# Patient Record
Sex: Male | Born: 2000 | Race: Black or African American | Hispanic: No | Marital: Single
Health system: Southern US, Community
[De-identification: ages and names within clinical notes are randomized; demographics above are authoritative.]

## PROBLEM LIST (undated history)

## (undated) HISTORY — PX: HIP SURGERY: SHX245

---
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
## 2019-07-20 ENCOUNTER — Encounter (HOSPITAL_COMMUNITY): Payer: Self-pay | Admitting: Emergency Medicine

## 2019-07-20 ENCOUNTER — Emergency Department (HOSPITAL_COMMUNITY)
Admission: EM | Admit: 2019-07-20 | Discharge: 2019-07-20 | Disposition: A | Discharge status: Home / Self Care | Payer: Medicaid Other | Attending: Emergency Medicine | Admitting: Emergency Medicine

## 2019-07-20 ENCOUNTER — Other Ambulatory Visit: Payer: Self-pay

## 2019-07-20 DIAGNOSIS — F17210 Nicotine dependence, cigarettes, uncomplicated: Secondary | ICD-10-CM | POA: Diagnosis not present

## 2019-07-20 DIAGNOSIS — Z202 Contact with and (suspected) exposure to infections with a predominantly sexual mode of transmission: Secondary | ICD-10-CM

## 2019-07-20 DIAGNOSIS — R369 Urethral discharge, unspecified: Secondary | ICD-10-CM | POA: Diagnosis present

## 2019-07-20 LAB — URINALYSIS, ROUTINE W REFLEX MICROSCOPIC
Bacteria, UA: NONE SEEN
Bilirubin Urine: NEGATIVE
Glucose, UA: NEGATIVE mg/dL
Hgb urine dipstick: NEGATIVE
Ketones, ur: NEGATIVE mg/dL
Nitrite: NEGATIVE
Protein, ur: 30 mg/dL — AB
Specific Gravity, Urine: 1.031 — ABNORMAL HIGH (ref 1.005–1.030)
WBC, UA: >50 WBC/hpf — ABNORMAL HIGH (ref 0–5)
pH: 5 (ref 5.0–8.0)

## 2019-07-20 MED ORDER — METRONIDAZOLE 500 MG PO TABS: 500.0000 mg | ORAL_TABLET | Freq: Two times a day (BID) | ORAL | 0 refills | Status: DC

## 2019-07-20 MED ORDER — CEFTRIAXONE SODIUM 250 MG IJ SOLR
250.0000 mg | Freq: Once | INTRAMUSCULAR | Status: AC
  Administered 2019-07-20: 05:00:00 250 mg via INTRAMUSCULAR
  Filled 2019-07-20: qty 250

## 2019-07-20 MED ORDER — STERILE WATER FOR INJECTION IJ SOLN
INTRAMUSCULAR | Status: AC
  Administered 2019-07-20: 10 mL
  Filled 2019-07-20: qty 10

## 2019-07-20 MED ORDER — AZITHROMYCIN 250 MG PO TABS
1000.0000 mg | ORAL_TABLET | Freq: Once | ORAL | Status: AC
  Administered 2019-07-20: 05:00:00 1000 mg via ORAL
  Filled 2019-07-20: qty 4

## 2019-07-20 NOTE — ED Provider Notes (Signed)
[facility] EMERGENCY DEPARTMENT Provider Note   CSN: [id number] Arrival date & time: 07/20/19  6789     History   Chief Complaint Chief Complaint  Patient presents with  . Exposure to STD    HPI ERIK BURKETT is a 18 y.o. male.     Patient is a 18 year old male presenting with complaints of urethral discharge and STD exposure.  He tells me he got a call from his girlfriend stating that she was diagnosed with gonorrhea, chlamydia, and trichomonas.  Patient reports some dysuria.  He denies any fevers or chills.  He denies any blisters or rash.  The history is provided by the patient.  Exposure to STD This is a new problem. The current episode started more than 1 week ago. The problem occurs constantly. The problem has been gradually worsening. Exacerbated by: Urinating. Nothing relieves the symptoms.    History reviewed. No pertinent past medical history.  Patient Active Problem List   Diagnosis Date Noted  . Possible exposure to STD 11/18/2017  . Exposure to chlamydia 11/18/2017    Past Surgical History:  Procedure Laterality Date  . HIP SURGERY          Home Medications    Prior to Admission medications   Medication Sig Start Date End Date Taking? Authorizing Provider  ibuprofen (ADVIL,MOTRIN) 600 MG tablet Take 1 tablet (600 mg total) by mouth every 6 (six) hours as needed. 07/20/17   Antony Madura, PA-C    Family History No family history on file.  Social History Social History   Tobacco Use  . Smoking status: Light Tobacco Smoker  . Smokeless tobacco: Never Used  Substance Use Topics  . Alcohol use: No  . Drug use: No     Allergies   Patient has no known allergies.   Review of Systems Review of Systems  All other systems reviewed and are negative.    Physical Exam Updated Vital Signs BP 123/76 (BP Location: Right Arm)   Pulse 83   Temp 99.3 F (37.4 C) (Oral)   Resp 16   Ht 5\' 9"  (1.753 m)   Wt 77.1 kg    SpO2 99%   BMI 25.10 kg/m   Physical Exam Vitals signs and nursing note reviewed.  Constitutional:      General: He is not in acute distress.    Appearance: Normal appearance. He is not ill-appearing, toxic-appearing or diaphoretic.  HENT:     Head: Normocephalic and atraumatic.  Pulmonary:     Effort: Pulmonary effort is normal.  Genitourinary:    Penis: Normal.      Comments: There is a slight purulent urethral discharge noted.  Penis testicles otherwise within normal limits.  There is no rash. Skin:    General: Skin is warm and dry.  Neurological:     Mental Status: He is alert and oriented to person, place, and time.      ED Treatments / Results  Labs (all labs ordered are listed, but only abnormal results are displayed) Labs Reviewed  URINALYSIS, ROUTINE W REFLEX MICROSCOPIC - Abnormal; Notable for the following components:      Result Value   Specific Gravity, Urine 1.031 (*)    Protein, ur 30 (*)    Leukocytes,Ua LARGE (*)    WBC, UA >50 (*)    All other components within normal limits  GC/CHLAMYDIA PROBE AMP ([place]) NOT AT [facility]    EKG None  Radiology No results found.  Procedures  Procedures (including critical care time)  Medications Ordered in ED Medications  azithromycin (ZITHROMAX) tablet 1,000 mg (has no administration in time range)  cefTRIAXone (ROCEPHIN) injection 250 mg (has no administration in time range)     Initial Impression / Assessment and Plan / ED Course  I have reviewed the triage vital signs and the nursing notes.  Pertinent labs & imaging results that were available during my care of the patient were reviewed by me and considered in my medical decision making (see chart for details).  Patient will be treated presumptively with Rocephin and Zithromax.  He will also be prescribed Flagyl for presumed trichomonas.  Patient to return as needed for any problems.  Final Clinical Impressions(s) / ED Diagnoses   Final diagnoses:   None    ED Discharge Orders    None       Veryl Speak, MD 07/20/19 [id number]

## 2019-07-20 NOTE — ED Triage Notes (Signed)
Patient requesting STD screening reports sexual partner was diagnosed with STD this week , mild penile discharge with no dysuria , denies fever or chills .

## 2019-07-20 NOTE — ED Notes (Signed)
Patient verbalizes understanding of discharge instructions. Opportunity for questioning and answers were provided. Armband removed by staff, pt discharged from ED home via POV.  

## 2019-07-20 NOTE — Discharge Instructions (Signed)
Begin taking Flagyl 100 mg twice daily for the next week.  Refrain from sexual activity for 2 weeks to allow the antibiotic to fully clear the infection.  Return to the emergency department if symptoms significantly worsen or change.

## 2019-10-12 ENCOUNTER — Ambulatory Visit: Payer: Medicaid Other | Admitting: Family Medicine

## 2019-10-15 ENCOUNTER — Ambulatory Visit: Payer: Medicaid Other

## 2019-11-01 ENCOUNTER — Other Ambulatory Visit (HOSPITAL_COMMUNITY)
Admission: RE | Admit: 2019-11-01 | Discharge: 2019-11-01 | Disposition: A | Discharge status: Home / Self Care | Payer: Medicaid Other | Source: Ambulatory Visit | Attending: Family Medicine | Admitting: Family Medicine

## 2019-11-01 ENCOUNTER — Other Ambulatory Visit: Payer: Self-pay

## 2019-11-01 ENCOUNTER — Ambulatory Visit (INDEPENDENT_AMBULATORY_CARE_PROVIDER_SITE_OTHER): Payer: Medicaid Other | Admitting: Family Medicine

## 2019-11-01 VITALS — BP 100/62 | HR 91 | Temp 98.2°F | Wt 175.0 lb

## 2019-11-01 DIAGNOSIS — Z202 Contact with and (suspected) exposure to infections with a predominantly sexual mode of transmission: Secondary | ICD-10-CM

## 2019-11-01 DIAGNOSIS — K649 Unspecified hemorrhoids: Secondary | ICD-10-CM | POA: Diagnosis not present

## 2019-11-01 MED ORDER — POLYETHYLENE GLYCOL 3350 17 GM/SCOOP PO POWD: 17.0000 g | Freq: Two times a day (BID) | ORAL | 1 refills | Status: DC | PRN

## 2019-11-01 NOTE — Patient Instructions (Signed)
Today we talked about your risk of exposure to sexually transmitted diseases.  We are going to do some testing here in the labs including HIV and syphilis in addition to the urine.  Want to remind you that HIV test really only covers for exposures that were released 3 months ago so you will need to reschedule in 3 months for another test.  We are also referring you to infectious disease so that you can have a discussion with them about potentially starting HIV PrEP medicine to reduce your risk of infection you are exposed in the future.  For your hemorrhoid we are suggesting that you try some MiraLAX, increase your water intake and vegetable intake.  Try not to strain on the toilet and can try some Epsom salt warm baths as well for relief.  If these do not, then down in the next few weeks and you wish to speak to the surgeon like we spoke about call us and we can look at putting that referral in  Dr. Parke Simmers

## 2019-11-02 DIAGNOSIS — K649 Unspecified hemorrhoids: Secondary | ICD-10-CM | POA: Insufficient documentation

## 2019-11-02 LAB — URINE CYTOLOGY ANCILLARY ONLY
Chlamydia: NEGATIVE
Comment: NEGATIVE
Comment: NEGATIVE
Comment: NORMAL
Neisseria Gonorrhea: NEGATIVE
Trichomonas: NEGATIVE

## 2019-11-02 LAB — HIV ANTIBODY (ROUTINE TESTING W REFLEX): HIV Screen 4th Generation wRfx: NONREACTIVE

## 2019-11-02 LAB — RPR: RPR Ser Ql: NONREACTIVE

## 2019-11-02 NOTE — Assessment & Plan Note (Signed)
Urine sample for G/C still pending at the time of this note, HIV and RPR negative.  We did discuss that due to the incubation period of HIV that he would need another test 3 months after his last sexual encounter.  We will also refer to infectious disease as patient is interested in conversation about potential starting prep given his repeated STD check visits

## 2019-11-02 NOTE — Assessment & Plan Note (Signed)
Nonthrombosed, noninfected external hemorrhoid  Discussed hydration, vegetable and fiber intake, potential of fiber supplementation, MiraLAX titrated to 1-2 soft bowel movements per day, can do sits baths with Epsom salts as well.  If these do not resolve his issues he has been advised to contact us for potential surgical referral although that should be considered last resort after conservative treatment

## 2019-11-02 NOTE — Progress Notes (Signed)
    SUBJECTIVE:   CHIEF COMPLAINT / HPI:   Patient comes in for STD check.  He said he has had multiple sexual partners in the last 6 months, he does say that one of them has contacted him and said that they had an STD.  And that he should get checked, he was unsure what specific STD they were saying but he would like to get the full panel of testing at this time.  He has no symptoms including dysuria, pelvic pain, penile lesions.  He said he is not engaged in receptive anal sex but he also has what he believes is a hemorrhoid because he says it sometimes hurts when he squeezes to have a bowel movement and that he "feels a bump back there ".  We did have a significant discussion about the potential of prep being viable to this patient to reduce risk of HIV transmission as he has had multiple visits for STD checks, he has multiple sexual partners and although he does state he uses barrier protection we think that he would benefit from infectious disease follow-up.  PERTINENT  PMH / PSH:   OBJECTIVE:   BP 100/62   Pulse 91   Temp 98.2 F (36.8 C) (Oral)   Wt 175 lb (79.4 kg)   SpO2 96%   BMI 25.84 kg/m   General: Athletic, pleasant, no distress Rectal exam: Visual rectal exam performed with patient's consent, there does appear to be a noninfected hemorrhoid approximately 1 cm on the anterior right border of the rectum Genital exam: Patient refuses  ASSESSMENT/PLAN:   Hemorrhoids Nonthrombosed, noninfected external hemorrhoid  Discussed hydration, vegetable and fiber intake, potential of fiber supplementation, MiraLAX titrated to 1-2 soft bowel movements per day, can do sits baths with Epsom salts as well.  If these do not resolve his issues he has been advised to contact us for potential surgical referral although that should be considered last resort after conservative treatment  Possible exposure to STD Urine sample for G/C still pending at the time of this note, HIV and RPR  negative.  We did discuss that due to the incubation period of HIV that he would need another test 3 months after his last sexual encounter.  We will also refer to infectious disease as patient is interested in conversation about potential starting prep given his repeated STD check visits     Marthenia Rolling, DO [facility]

## 2019-11-05 ENCOUNTER — Encounter: Payer: Self-pay | Admitting: Family Medicine

## 2019-12-24 ENCOUNTER — Emergency Department (HOSPITAL_COMMUNITY): Payer: Medicaid Other

## 2019-12-24 ENCOUNTER — Emergency Department (HOSPITAL_COMMUNITY)
Admission: EM | Admit: 2019-12-24 | Discharge: 2019-12-25 | Disposition: A | Discharge status: Home / Self Care | Payer: Medicaid Other | Attending: Emergency Medicine | Admitting: Emergency Medicine

## 2019-12-24 DIAGNOSIS — S3993XA Unspecified injury of pelvis, initial encounter: Secondary | ICD-10-CM | POA: Diagnosis not present

## 2019-12-24 DIAGNOSIS — S81812A Laceration without foreign body, left lower leg, initial encounter: Secondary | ICD-10-CM | POA: Diagnosis not present

## 2019-12-24 DIAGNOSIS — Y939 Activity, unspecified: Secondary | ICD-10-CM | POA: Insufficient documentation

## 2019-12-24 DIAGNOSIS — F1490 Cocaine use, unspecified, uncomplicated: Secondary | ICD-10-CM | POA: Insufficient documentation

## 2019-12-24 DIAGNOSIS — T07XXXA Unspecified multiple injuries, initial encounter: Secondary | ICD-10-CM

## 2019-12-24 DIAGNOSIS — S8992XA Unspecified injury of left lower leg, initial encounter: Secondary | ICD-10-CM | POA: Diagnosis not present

## 2019-12-24 DIAGNOSIS — S3991XA Unspecified injury of abdomen, initial encounter: Secondary | ICD-10-CM | POA: Diagnosis not present

## 2019-12-24 DIAGNOSIS — F1910 Other psychoactive substance abuse, uncomplicated: Secondary | ICD-10-CM

## 2019-12-24 DIAGNOSIS — F1290 Cannabis use, unspecified, uncomplicated: Secondary | ICD-10-CM | POA: Diagnosis not present

## 2019-12-24 DIAGNOSIS — R404 Transient alteration of awareness: Secondary | ICD-10-CM | POA: Insufficient documentation

## 2019-12-24 DIAGNOSIS — M7989 Other specified soft tissue disorders: Secondary | ICD-10-CM | POA: Diagnosis not present

## 2019-12-24 DIAGNOSIS — R4781 Slurred speech: Secondary | ICD-10-CM | POA: Diagnosis not present

## 2019-12-24 DIAGNOSIS — Y906 Blood alcohol level of 120-199 mg/100 ml: Secondary | ICD-10-CM | POA: Diagnosis not present

## 2019-12-24 DIAGNOSIS — S199XXA Unspecified injury of neck, initial encounter: Secondary | ICD-10-CM | POA: Diagnosis not present

## 2019-12-24 DIAGNOSIS — F10920 Alcohol use, unspecified with intoxication, uncomplicated: Secondary | ICD-10-CM | POA: Diagnosis not present

## 2019-12-24 DIAGNOSIS — Y929 Unspecified place or not applicable: Secondary | ICD-10-CM | POA: Diagnosis not present

## 2019-12-24 DIAGNOSIS — Y999 Unspecified external cause status: Secondary | ICD-10-CM | POA: Insufficient documentation

## 2019-12-24 DIAGNOSIS — S0990XA Unspecified injury of head, initial encounter: Secondary | ICD-10-CM | POA: Diagnosis not present

## 2019-12-24 DIAGNOSIS — S299XXA Unspecified injury of thorax, initial encounter: Secondary | ICD-10-CM | POA: Diagnosis not present

## 2019-12-24 LAB — CBC
HCT: 45.8 % (ref 39.0–52.0)
Hemoglobin: 14.7 g/dL (ref 13.0–17.0)
MCH: 30.9 pg (ref 26.0–34.0)
MCHC: 32.1 g/dL (ref 30.0–36.0)
MCV: 96.2 fL (ref 80.0–100.0)
Platelets: 300 10*3/uL (ref 150–400)
RBC: 4.76 MIL/uL (ref 4.22–5.81)
RDW: 13.2 % (ref 11.5–15.5)
WBC: 20.1 10*3/uL — ABNORMAL HIGH (ref 4.0–10.5)
nRBC: 0 % (ref 0.0–0.2)
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
## 2019-12-25 ENCOUNTER — Emergency Department (HOSPITAL_COMMUNITY): Payer: Medicaid Other

## 2019-12-25 ENCOUNTER — Other Ambulatory Visit: Payer: Self-pay

## 2019-12-25 DIAGNOSIS — S199XXA Unspecified injury of neck, initial encounter: Secondary | ICD-10-CM | POA: Diagnosis not present

## 2019-12-25 DIAGNOSIS — S3991XA Unspecified injury of abdomen, initial encounter: Secondary | ICD-10-CM | POA: Diagnosis not present

## 2019-12-25 DIAGNOSIS — S299XXA Unspecified injury of thorax, initial encounter: Secondary | ICD-10-CM | POA: Diagnosis not present

## 2019-12-25 DIAGNOSIS — S0990XA Unspecified injury of head, initial encounter: Secondary | ICD-10-CM | POA: Diagnosis not present

## 2019-12-25 LAB — I-STAT CHEM 8, ED
BUN: 23 mg/dL — ABNORMAL HIGH (ref 6–20)
Calcium, Ion: 1.07 mmol/L — ABNORMAL LOW (ref 1.15–1.40)
Chloride: 103 mmol/L (ref 98–111)
Creatinine, Ser: 1.2 mg/dL (ref 0.61–1.24)
Glucose, Bld: 55 mg/dL — ABNORMAL LOW (ref 70–99)
HCT: 47 % (ref 39.0–52.0)
Hemoglobin: 16 g/dL (ref 13.0–17.0)
Potassium: 4 mmol/L (ref 3.5–5.1)
Sodium: 138 mmol/L (ref 135–145)
TCO2: 24 mmol/L (ref 22–32)

## 2019-12-25 LAB — RAPID URINE DRUG SCREEN, HOSP PERFORMED
Amphetamines: NOT DETECTED
Barbiturates: NOT DETECTED
Benzodiazepines: POSITIVE — AB
Cocaine: POSITIVE — AB
Opiates: NOT DETECTED
Tetrahydrocannabinol: POSITIVE — AB

## 2019-12-25 LAB — ETHANOL: Alcohol, Ethyl (B): 138 mg/dL — ABNORMAL HIGH (ref <10)

## 2019-12-25 LAB — URINALYSIS, ROUTINE W REFLEX MICROSCOPIC
Bacteria, UA: NONE SEEN
Bilirubin Urine: NEGATIVE
Glucose, UA: 50 mg/dL — AB
Ketones, ur: 5 mg/dL — AB
Leukocytes,Ua: NEGATIVE
Nitrite: NEGATIVE
Protein, ur: 100 mg/dL — AB
Specific Gravity, Urine: 1.025 (ref 1.005–1.030)
pH: 5 (ref 5.0–8.0)

## 2019-12-25 LAB — COMPREHENSIVE METABOLIC PANEL
ALT: 419 U/L — ABNORMAL HIGH (ref 0–44)
AST: 471 U/L — ABNORMAL HIGH (ref 15–41)
Albumin: 4.8 g/dL (ref 3.5–5.0)
Alkaline Phosphatase: 49 U/L (ref 38–126)
Anion gap: 21 — ABNORMAL HIGH (ref 5–15)
BUN: 20 mg/dL (ref 6–20)
CO2: 18 mmol/L — ABNORMAL LOW (ref 22–32)
Calcium: 9.4 mg/dL (ref 8.9–10.3)
Chloride: 98 mmol/L (ref 98–111)
Creatinine, Ser: 1.18 mg/dL (ref 0.61–1.24)
GFR calc Af Amer: 43 mL/min — ABNORMAL LOW (ref >60)
GFR calc non Af Amer: 37 mL/min — ABNORMAL LOW (ref >60)
Glucose, Bld: 60 mg/dL — ABNORMAL LOW (ref 70–99)
Potassium: 3.9 mmol/L (ref 3.5–5.1)
Sodium: 137 mmol/L (ref 135–145)
Total Bilirubin: 0.9 mg/dL (ref 0.3–1.2)
Total Protein: 7.7 g/dL (ref 6.5–8.1)

## 2019-12-25 LAB — CBG MONITORING, ED
Glucose-Capillary: 163 mg/dL — ABNORMAL HIGH (ref 70–99)
Glucose-Capillary: 389 mg/dL — ABNORMAL HIGH (ref 70–99)

## 2019-12-25 LAB — PROTIME-INR
INR: 1.1 (ref 0.8–1.2)
Prothrombin Time: 13.7 seconds (ref 11.4–15.2)

## 2019-12-25 LAB — LACTIC ACID, PLASMA: Lactic Acid, Venous: 5.5 mmol/L (ref 0.5–1.9)

## 2019-12-25 MED ORDER — NAPROXEN 500 MG PO TABS
500.0000 mg | ORAL_TABLET | Freq: Two times a day (BID) | ORAL | 0 refills | Status: DC
Start: 2019-12-25 — End: 2022-05-20

## 2019-12-25 MED ORDER — IOHEXOL 300 MG/ML  SOLN
100.0000 mL | Freq: Once | INTRAMUSCULAR | Status: AC | PRN
  Administered 2019-12-25: 01:00:00 100 mL via INTRAVENOUS

## 2019-12-25 MED ORDER — DEXTROSE 50 % IV SOLN
1.0000 | Freq: Once | INTRAVENOUS | Status: AC
  Administered 2019-12-25: 50 mL via INTRAVENOUS
  Filled 2019-12-25: qty 50
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
## 2019-12-25 NOTE — ED Notes (Signed)
Pt verbalized understanding of d/c instructions, follow up care, s/s requiring return to ed and prescriptions. Pt had no further questions at this time. Pt able to ambulate around room unassisted. Pt transported to exit via wheelchair.
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
## 2020-05-23 ENCOUNTER — Emergency Department (HOSPITAL_COMMUNITY): Payer: Medicaid Other

## 2020-05-23 ENCOUNTER — Observation Stay (HOSPITAL_COMMUNITY): Payer: Medicaid Other

## 2020-05-23 ENCOUNTER — Other Ambulatory Visit: Payer: Self-pay

## 2020-05-23 ENCOUNTER — Inpatient Hospital Stay (HOSPITAL_COMMUNITY)
Admission: EM | Admit: 2020-05-23 | Discharge: 2020-05-26 | DRG: 511 | Disposition: A | Discharge status: Home / Self Care | Payer: Medicaid Other | Attending: Physician Assistant | Admitting: Physician Assistant

## 2020-05-23 ENCOUNTER — Encounter (HOSPITAL_COMMUNITY): Payer: Self-pay | Admitting: *Deleted

## 2020-05-23 DIAGNOSIS — D62 Acute posthemorrhagic anemia: Secondary | ICD-10-CM | POA: Diagnosis present

## 2020-05-23 DIAGNOSIS — M79641 Pain in right hand: Secondary | ICD-10-CM | POA: Diagnosis not present

## 2020-05-23 DIAGNOSIS — S52001B Unspecified fracture of upper end of right ulna, initial encounter for open fracture type I or II: Secondary | ICD-10-CM | POA: Diagnosis not present

## 2020-05-23 DIAGNOSIS — S52231A Displaced oblique fracture of shaft of right ulna, initial encounter for closed fracture: Principal | ICD-10-CM | POA: Diagnosis present

## 2020-05-23 DIAGNOSIS — W3400XA Accidental discharge from unspecified firearms or gun, initial encounter: Secondary | ICD-10-CM

## 2020-05-23 DIAGNOSIS — R000 Tachycardia, unspecified: Secondary | ICD-10-CM | POA: Diagnosis not present

## 2020-05-23 DIAGNOSIS — S52201A Unspecified fracture of shaft of right ulna, initial encounter for closed fracture: Secondary | ICD-10-CM | POA: Diagnosis not present

## 2020-05-23 DIAGNOSIS — S41001A Unspecified open wound of right shoulder, initial encounter: Secondary | ICD-10-CM | POA: Diagnosis not present

## 2020-05-23 DIAGNOSIS — R042 Hemoptysis: Secondary | ICD-10-CM | POA: Diagnosis not present

## 2020-05-23 DIAGNOSIS — S41101A Unspecified open wound of right upper arm, initial encounter: Secondary | ICD-10-CM | POA: Diagnosis not present

## 2020-05-23 DIAGNOSIS — S27329A Contusion of lung, unspecified, initial encounter: Secondary | ICD-10-CM

## 2020-05-23 DIAGNOSIS — Z20822 Contact with and (suspected) exposure to covid-19: Secondary | ICD-10-CM | POA: Diagnosis not present

## 2020-05-23 DIAGNOSIS — S42151A Displaced fracture of neck of scapula, right shoulder, initial encounter for closed fracture: Secondary | ICD-10-CM | POA: Diagnosis not present

## 2020-05-23 DIAGNOSIS — S52251A Displaced comminuted fracture of shaft of ulna, right arm, initial encounter for closed fracture: Secondary | ICD-10-CM | POA: Diagnosis not present

## 2020-05-23 DIAGNOSIS — S27321A Contusion of lung, unilateral, initial encounter: Secondary | ICD-10-CM | POA: Diagnosis not present

## 2020-05-23 DIAGNOSIS — Z23 Encounter for immunization: Secondary | ICD-10-CM | POA: Diagnosis not present

## 2020-05-23 DIAGNOSIS — S2242XA Multiple fractures of ribs, left side, initial encounter for closed fracture: Secondary | ICD-10-CM | POA: Diagnosis not present

## 2020-05-23 DIAGNOSIS — S21302A Unspecified open wound of left front wall of thorax with penetration into thoracic cavity, initial encounter: Secondary | ICD-10-CM | POA: Diagnosis not present

## 2020-05-23 DIAGNOSIS — Y92008 Other place in unspecified non-institutional (private) residence as the place of occurrence of the external cause: Secondary | ICD-10-CM

## 2020-05-23 DIAGNOSIS — J984 Other disorders of lung: Secondary | ICD-10-CM | POA: Diagnosis not present

## 2020-05-23 DIAGNOSIS — J90 Pleural effusion, not elsewhere classified: Secondary | ICD-10-CM | POA: Diagnosis not present

## 2020-05-23 DIAGNOSIS — S42101B Fracture of unspecified part of scapula, right shoulder, initial encounter for open fracture: Secondary | ICD-10-CM | POA: Diagnosis not present

## 2020-05-23 DIAGNOSIS — S52231B Displaced oblique fracture of shaft of right ulna, initial encounter for open fracture type I or II: Secondary | ICD-10-CM | POA: Diagnosis present

## 2020-05-23 DIAGNOSIS — S51831A Puncture wound without foreign body of right forearm, initial encounter: Secondary | ICD-10-CM | POA: Diagnosis not present

## 2020-05-23 DIAGNOSIS — Z9889 Other specified postprocedural states: Secondary | ICD-10-CM | POA: Diagnosis not present

## 2020-05-23 DIAGNOSIS — T148XXA Other injury of unspecified body region, initial encounter: Secondary | ICD-10-CM

## 2020-05-23 DIAGNOSIS — T1490XA Injury, unspecified, initial encounter: Secondary | ICD-10-CM

## 2020-05-23 DIAGNOSIS — S42191A Fracture of other part of scapula, right shoulder, initial encounter for closed fracture: Secondary | ICD-10-CM | POA: Diagnosis not present

## 2020-05-23 DIAGNOSIS — M795 Residual foreign body in soft tissue: Secondary | ICD-10-CM | POA: Diagnosis not present

## 2020-05-23 DIAGNOSIS — S3991XA Unspecified injury of abdomen, initial encounter: Secondary | ICD-10-CM | POA: Diagnosis not present

## 2020-05-23 DIAGNOSIS — S31000A Unspecified open wound of lower back and pelvis without penetration into retroperitoneum, initial encounter: Secondary | ICD-10-CM | POA: Diagnosis not present

## 2020-05-23 DIAGNOSIS — M79601 Pain in right arm: Secondary | ICD-10-CM | POA: Diagnosis not present

## 2020-05-23 DIAGNOSIS — T07XXXA Unspecified multiple injuries, initial encounter: Secondary | ICD-10-CM

## 2020-05-23 DIAGNOSIS — S72001A Fracture of unspecified part of neck of right femur, initial encounter for closed fracture: Secondary | ICD-10-CM

## 2020-05-23 LAB — I-STAT CHEM 8, ED
BUN: 17 mg/dL (ref 6–20)
Calcium, Ion: 1.11 mmol/L — ABNORMAL LOW (ref 1.15–1.40)
Chloride: 102 mmol/L (ref 98–111)
Creatinine, Ser: 1 mg/dL (ref 0.61–1.24)
Glucose, Bld: 130 mg/dL — ABNORMAL HIGH (ref 70–99)
HCT: 41 % (ref 39.0–52.0)
Hemoglobin: 13.9 g/dL (ref 13.0–17.0)
Potassium: 3.4 mmol/L — ABNORMAL LOW (ref 3.5–5.1)
Sodium: 139 mmol/L (ref 135–145)
TCO2: 25 mmol/L (ref 22–32)

## 2020-05-23 LAB — CBC
HCT: 41 % (ref 39.0–52.0)
HCT: 36.6 % — ABNORMAL LOW (ref 39.0–52.0)
Hemoglobin: 13.3 g/dL (ref 13.0–17.0)
Hemoglobin: 12.4 g/dL — ABNORMAL LOW (ref 13.0–17.0)
MCH: 31.1 pg (ref 26.0–34.0)
MCH: 31.9 pg (ref 26.0–34.0)
MCHC: 32.4 g/dL (ref 30.0–36.0)
MCHC: 33.9 g/dL (ref 30.0–36.0)
MCV: 96 fL (ref 80.0–100.0)
MCV: 94.1 fL (ref 80.0–100.0)
Platelets: 346 10*3/uL (ref 150–400)
Platelets: 311 10*3/uL (ref 150–400)
RBC: 4.27 MIL/uL (ref 4.22–5.81)
RBC: 3.89 MIL/uL — ABNORMAL LOW (ref 4.22–5.81)
RDW: 13.1 % (ref 11.5–15.5)
RDW: 13 % (ref 11.5–15.5)
WBC: 11.7 10*3/uL — ABNORMAL HIGH (ref 4.0–10.5)
WBC: 18.2 10*3/uL — ABNORMAL HIGH (ref 4.0–10.5)
nRBC: 0 % (ref 0.0–0.2)
nRBC: 0 % (ref 0.0–0.2)

## 2020-05-23 LAB — SAMPLE TO BLOOD BANK

## 2020-05-23 LAB — CREATININE, SERUM
Creatinine, Ser: 0.89 mg/dL (ref 0.61–1.24)
GFR calc Af Amer: >60 mL/min (ref >60)
GFR calc non Af Amer: >60 mL/min (ref >60)

## 2020-05-23 LAB — PROTIME-INR
INR: 1 (ref 0.8–1.2)
Prothrombin Time: 12.6 seconds (ref 11.4–15.2)

## 2020-05-23 LAB — COMPREHENSIVE METABOLIC PANEL
ALT: 26 U/L (ref 0–44)
AST: 35 U/L (ref 15–41)
Albumin: 4.4 g/dL (ref 3.5–5.0)
Alkaline Phosphatase: 45 U/L (ref 38–126)
Anion gap: 14 (ref 5–15)
BUN: 14 mg/dL (ref 6–20)
CO2: 22 mmol/L (ref 22–32)
Calcium: 9 mg/dL (ref 8.9–10.3)
Chloride: 102 mmol/L (ref 98–111)
Creatinine, Ser: 1.01 mg/dL (ref 0.61–1.24)
GFR calc Af Amer: >60 mL/min (ref >60)
GFR calc non Af Amer: >60 mL/min (ref >60)
Glucose, Bld: 131 mg/dL — ABNORMAL HIGH (ref 70–99)
Potassium: 3.5 mmol/L (ref 3.5–5.1)
Sodium: 138 mmol/L (ref 135–145)
Total Bilirubin: 0.7 mg/dL (ref 0.3–1.2)
Total Protein: 6.9 g/dL (ref 6.5–8.1)

## 2020-05-23 LAB — ETHANOL: Alcohol, Ethyl (B): 11 mg/dL — ABNORMAL HIGH (ref <10)

## 2020-05-23 LAB — RESPIRATORY PANEL BY RT PCR (FLU A&B, COVID)
Influenza A by PCR: NEGATIVE
Influenza B by PCR: NEGATIVE
SARS Coronavirus 2 by RT PCR: NEGATIVE

## 2020-05-23 LAB — LACTIC ACID, PLASMA: Lactic Acid, Venous: 3 mmol/L (ref 0.5–1.9)

## 2020-05-23 LAB — HIV ANTIBODY (ROUTINE TESTING W REFLEX): HIV Screen 4th Generation wRfx: NONREACTIVE

## 2020-05-23 MED ORDER — ACETAMINOPHEN 325 MG PO TABS: 650.0000 mg | ORAL_TABLET | ORAL | Status: DC | PRN

## 2020-05-23 MED ORDER — SODIUM CHLORIDE 0.9 % IV SOLN
INTRAVENOUS | Status: AC | PRN
  Administered 2020-05-23: 1000 mL via INTRAVENOUS

## 2020-05-23 MED ORDER — OXYCODONE HCL 5 MG PO TABS
5.0000 mg | ORAL_TABLET | ORAL | Status: DC | PRN
  Administered 2020-05-23 – 2020-05-24 (×4): 10 mg via ORAL
  Administered 2020-05-24: 5 mg via ORAL
  Administered 2020-05-25 – 2020-05-26 (×4): 10 mg via ORAL
  Filled 2020-05-23 (×7): qty 2
  Filled 2020-05-23: qty 1
  Filled 2020-05-23: qty 2

## 2020-05-23 MED ORDER — CEFAZOLIN SODIUM-DEXTROSE 2-4 GM/100ML-% IV SOLN
2.0000 g | Freq: Three times a day (TID) | INTRAVENOUS | Status: DC
  Administered 2020-05-23 – 2020-05-24 (×4): 2 g via INTRAVENOUS
  Filled 2020-05-23 (×4): qty 100

## 2020-05-23 MED ORDER — IOHEXOL 300 MG/ML  SOLN
100.0000 mL | Freq: Once | INTRAMUSCULAR | Status: AC | PRN
  Administered 2020-05-23: 100 mL via INTRAVENOUS

## 2020-05-23 MED ORDER — MORPHINE SULFATE (PF) 2 MG/ML IV SOLN
2.0000 mg | INTRAVENOUS | Status: DC | PRN
  Administered 2020-05-23: 4 mg via INTRAVENOUS
  Filled 2020-05-23: qty 2

## 2020-05-23 MED ORDER — ONDANSETRON HCL 4 MG/2ML IJ SOLN: 4.0000 mg | Freq: Four times a day (QID) | INTRAMUSCULAR | Status: DC | PRN

## 2020-05-23 MED ORDER — OXYCODONE HCL 5 MG PO TABS
5.0000 mg | ORAL_TABLET | ORAL | Status: DC | PRN
  Administered 2020-05-23: 5 mg via ORAL
  Filled 2020-05-23: qty 1

## 2020-05-23 MED ORDER — ENSURE PRE-SURGERY PO LIQD
296.0000 mL | Freq: Once | ORAL | Status: AC
  Administered 2020-05-24: 296 mL via ORAL
  Filled 2020-05-23: qty 296

## 2020-05-23 MED ORDER — SODIUM CHLORIDE 0.9 % IV SOLN: INTRAVENOUS | Status: DC

## 2020-05-23 MED ORDER — CEFAZOLIN SODIUM-DEXTROSE 2-4 GM/100ML-% IV SOLN
2.0000 g | Freq: Once | INTRAVENOUS | Status: AC
  Administered 2020-05-23: 2 g via INTRAVENOUS
  Filled 2020-05-23: qty 100

## 2020-05-23 MED ORDER — FENTANYL CITRATE (PF) 100 MCG/2ML IJ SOLN
INTRAMUSCULAR | Status: AC | PRN
  Administered 2020-05-23: 100 ug via INTRAVENOUS

## 2020-05-23 MED ORDER — CEFAZOLIN SODIUM-DEXTROSE 2-4 GM/100ML-% IV SOLN
2.0000 g | INTRAVENOUS | Status: AC
  Administered 2020-05-24: 2 g via INTRAVENOUS
  Filled 2020-05-23: qty 100

## 2020-05-23 MED ORDER — POLYETHYLENE GLYCOL 3350 17 G PO PACK
17.0000 g | PACK | Freq: Every day | ORAL | Status: DC
  Administered 2020-05-23 – 2020-05-26 (×3): 17 g via ORAL
  Filled 2020-05-23 (×3): qty 1

## 2020-05-23 MED ORDER — ENOXAPARIN SODIUM 30 MG/0.3ML ~~LOC~~ SOLN
30.0000 mg | Freq: Two times a day (BID) | SUBCUTANEOUS | Status: DC
  Administered 2020-05-23 – 2020-05-26 (×6): 30 mg via SUBCUTANEOUS
  Filled 2020-05-23 (×6): qty 0.3

## 2020-05-23 MED ORDER — TETANUS-DIPHTH-ACELL PERTUSSIS 5-2.5-18.5 LF-MCG/0.5 IM SUSP
0.5000 mL | Freq: Once | INTRAMUSCULAR | Status: AC
  Administered 2020-05-23: 0.5 mL via INTRAMUSCULAR
  Filled 2020-05-23: qty 0.5

## 2020-05-23 MED ORDER — MORPHINE SULFATE (PF) 2 MG/ML IV SOLN
2.0000 mg | INTRAVENOUS | Status: DC | PRN
  Administered 2020-05-23 – 2020-05-24 (×5): 2 mg via INTRAVENOUS
  Filled 2020-05-23 (×5): qty 1

## 2020-05-23 MED ORDER — POVIDONE-IODINE 10 % EX SWAB: 2.0000 "application " | Freq: Once | CUTANEOUS | Status: DC

## 2020-05-23 MED ORDER — FENTANYL CITRATE (PF) 100 MCG/2ML IJ SOLN
INTRAMUSCULAR | Status: AC
  Filled 2020-05-23: qty 2

## 2020-05-23 MED ORDER — DOCUSATE SODIUM 100 MG PO CAPS
100.0000 mg | ORAL_CAPSULE | Freq: Two times a day (BID) | ORAL | Status: DC
  Administered 2020-05-23 – 2020-05-26 (×6): 100 mg via ORAL
  Filled 2020-05-23 (×6): qty 1

## 2020-05-23 MED ORDER — ONDANSETRON 4 MG PO TBDP
4.0000 mg | ORAL_TABLET | Freq: Four times a day (QID) | ORAL | Status: DC | PRN
  Filled 2020-05-23: qty 1

## 2020-05-23 MED ORDER — CHLORHEXIDINE GLUCONATE 4 % EX LIQD: 60.0000 mL | Freq: Once | CUTANEOUS | Status: DC
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
## 2020-05-23 NOTE — Plan of Care (Signed)
  Problem: Education: Goal: Verbalization of understanding the information provided will improve Outcome: Progressing   Problem: Coping: Goal: Ability to demonstrate appropriate behavior when angry will improve Outcome: Progressing Goal: Ability to identify and develop effective coping behavior will improve Outcome: Progressing Goal: Ability to interact with others will improve Outcome: Progressing
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
## 2020-05-23 NOTE — Progress Notes (Signed)
Orthopedic Tech Progress Note Patient Details:  Billy Bailey 02/03/2001 [id number]  Ortho Devices Type of Ortho Device: Long arm splint Ortho Device/Splint Location: RUE Ortho Device/Splint Interventions: Ordered, Application, Adjustment   Post Interventions Patient Tolerated: Well Instructions Provided: Care of device, Poper ambulation with device   [name] 05/23/2020, 3:38 PM

## 2020-05-23 NOTE — ED Notes (Signed)
Ct ready room 2 after cleared by provider

## 2020-05-23 NOTE — ED Notes (Signed)
Assumed care on patient , returned from CT scan , respirations unlabored , IV sites intact .

## 2020-05-23 NOTE — Progress Notes (Addendum)
Patient showing ST elevation on cardiac monitor. 2L oxygen via nasal canula applied and MD notified. Patient declines chest pain and is unchanged from previous assessment.
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
## 2020-05-23 NOTE — TOC CAGE-AID Note (Signed)
Transition of Care [facility]) - CAGE-AID Screening   Patient Details  Name: Billy Bailey MRN: [id number] Date of Birth: 2000/10/21  Transition of Care [facility] CM/SW Contact:    Emeterio Reeve, [place] Phone Number: 05/23/2020, 11:58 AM   Clinical Narrative:  CSW met with pt at bedside. CSW introduced self and explained her role at the hospital.  Pt reports he has had alcohol a handful of times. Pt reports once every couple months. Pt denies substance use. Pt did not need resources at this time.   CAGE-AID Screening:    Have You Ever Felt You Ought to Cut Down on Your Drinking or Drug Use?: No Have People Annoyed You By Critizing Your Drinking Or Drug Use?: No Have You Felt Bad Or Guilty About Your Drinking Or Drug Use?: No Have You Ever Had a Drink or Used Drugs First Thing In The Morning to Steady Your Nerves or to Get Rid of a Hangover?: No CAGE-AID Score: 0  Substance Abuse Education Offered: Yes  Substance abuse interventions: Patient Counseling  Emeterio Reeve, Latanya Presser, [place] Social Worker [phone]
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
## 2020-05-23 NOTE — ED Notes (Signed)
Patient transported to CT 
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
## 2020-05-24 ENCOUNTER — Observation Stay (HOSPITAL_COMMUNITY): Payer: Medicaid Other | Admitting: Certified Registered"

## 2020-05-24 ENCOUNTER — Observation Stay (HOSPITAL_COMMUNITY): Payer: Medicaid Other

## 2020-05-24 ENCOUNTER — Encounter (HOSPITAL_COMMUNITY): Admission: EM | Disposition: A | Discharge status: Home / Self Care | Payer: Self-pay | Source: Home / Self Care

## 2020-05-24 DIAGNOSIS — Z23 Encounter for immunization: Secondary | ICD-10-CM | POA: Diagnosis not present

## 2020-05-24 DIAGNOSIS — D62 Acute posthemorrhagic anemia: Secondary | ICD-10-CM | POA: Diagnosis not present

## 2020-05-24 DIAGNOSIS — S52231A Displaced oblique fracture of shaft of right ulna, initial encounter for closed fracture: Secondary | ICD-10-CM | POA: Diagnosis not present

## 2020-05-24 DIAGNOSIS — S52602D Unspecified fracture of lower end of left ulna, subsequent encounter for closed fracture with routine healing: Secondary | ICD-10-CM | POA: Diagnosis not present

## 2020-05-24 DIAGNOSIS — S52231B Displaced oblique fracture of shaft of right ulna, initial encounter for open fracture type I or II: Secondary | ICD-10-CM | POA: Diagnosis not present

## 2020-05-24 DIAGNOSIS — S42151A Displaced fracture of neck of scapula, right shoulder, initial encounter for closed fracture: Secondary | ICD-10-CM | POA: Diagnosis not present

## 2020-05-24 DIAGNOSIS — Z9889 Other specified postprocedural states: Secondary | ICD-10-CM | POA: Diagnosis not present

## 2020-05-24 DIAGNOSIS — S2242XA Multiple fractures of ribs, left side, initial encounter for closed fracture: Secondary | ICD-10-CM | POA: Diagnosis not present

## 2020-05-24 DIAGNOSIS — S52251A Displaced comminuted fracture of shaft of ulna, right arm, initial encounter for closed fracture: Secondary | ICD-10-CM | POA: Diagnosis not present

## 2020-05-24 DIAGNOSIS — M795 Residual foreign body in soft tissue: Secondary | ICD-10-CM | POA: Diagnosis not present

## 2020-05-24 DIAGNOSIS — S27321A Contusion of lung, unilateral, initial encounter: Secondary | ICD-10-CM | POA: Diagnosis not present

## 2020-05-24 DIAGNOSIS — S27329A Contusion of lung, unspecified, initial encounter: Secondary | ICD-10-CM | POA: Diagnosis not present

## 2020-05-24 DIAGNOSIS — S42101B Fracture of unspecified part of scapula, right shoulder, initial encounter for open fracture: Secondary | ICD-10-CM | POA: Diagnosis not present

## 2020-05-24 DIAGNOSIS — S41001A Unspecified open wound of right shoulder, initial encounter: Secondary | ICD-10-CM | POA: Diagnosis not present

## 2020-05-24 DIAGNOSIS — S42191A Fracture of other part of scapula, right shoulder, initial encounter for closed fracture: Secondary | ICD-10-CM | POA: Diagnosis not present

## 2020-05-24 DIAGNOSIS — M79601 Pain in right arm: Secondary | ICD-10-CM | POA: Diagnosis not present

## 2020-05-24 DIAGNOSIS — K649 Unspecified hemorrhoids: Secondary | ICD-10-CM | POA: Diagnosis not present

## 2020-05-24 DIAGNOSIS — R000 Tachycardia, unspecified: Secondary | ICD-10-CM | POA: Diagnosis not present

## 2020-05-24 DIAGNOSIS — R042 Hemoptysis: Secondary | ICD-10-CM | POA: Diagnosis not present

## 2020-05-24 DIAGNOSIS — S3991XA Unspecified injury of abdomen, initial encounter: Secondary | ICD-10-CM | POA: Diagnosis not present

## 2020-05-24 DIAGNOSIS — S31000A Unspecified open wound of lower back and pelvis without penetration into retroperitoneum, initial encounter: Secondary | ICD-10-CM | POA: Diagnosis not present

## 2020-05-24 DIAGNOSIS — S21302A Unspecified open wound of left front wall of thorax with penetration into thoracic cavity, initial encounter: Secondary | ICD-10-CM | POA: Diagnosis not present

## 2020-05-24 DIAGNOSIS — W3400XA Accidental discharge from unspecified firearms or gun, initial encounter: Secondary | ICD-10-CM | POA: Diagnosis not present

## 2020-05-24 DIAGNOSIS — Z20822 Contact with and (suspected) exposure to covid-19: Secondary | ICD-10-CM | POA: Diagnosis not present

## 2020-05-24 DIAGNOSIS — S52201A Unspecified fracture of shaft of right ulna, initial encounter for closed fracture: Secondary | ICD-10-CM | POA: Diagnosis not present

## 2020-05-24 DIAGNOSIS — S52001B Unspecified fracture of upper end of right ulna, initial encounter for open fracture type I or II: Secondary | ICD-10-CM | POA: Diagnosis not present

## 2020-05-24 DIAGNOSIS — S41101A Unspecified open wound of right upper arm, initial encounter: Secondary | ICD-10-CM | POA: Diagnosis not present

## 2020-05-24 DIAGNOSIS — J984 Other disorders of lung: Secondary | ICD-10-CM | POA: Diagnosis not present

## 2020-05-24 DIAGNOSIS — Y92008 Other place in unspecified non-institutional (private) residence as the place of occurrence of the external cause: Secondary | ICD-10-CM | POA: Diagnosis not present

## 2020-05-24 HISTORY — PX: I & D EXTREMITY: SHX5045

## 2020-05-24 HISTORY — PX: ORIF ULNAR FRACTURE: SHX5417

## 2020-05-24 LAB — BASIC METABOLIC PANEL
Anion gap: 7 (ref 5–15)
BUN: 8 mg/dL (ref 6–20)
CO2: 24 mmol/L (ref 22–32)
Calcium: 8.5 mg/dL — ABNORMAL LOW (ref 8.9–10.3)
Chloride: 105 mmol/L (ref 98–111)
Creatinine, Ser: 0.91 mg/dL (ref 0.61–1.24)
GFR calc Af Amer: >60 mL/min (ref >60)
GFR calc non Af Amer: >60 mL/min (ref >60)
Glucose, Bld: 134 mg/dL — ABNORMAL HIGH (ref 70–99)
Potassium: 3.3 mmol/L — ABNORMAL LOW (ref 3.5–5.1)
Sodium: 136 mmol/L (ref 135–145)

## 2020-05-24 LAB — CBC
HCT: 32.7 % — ABNORMAL LOW (ref 39.0–52.0)
Hemoglobin: 10.9 g/dL — ABNORMAL LOW (ref 13.0–17.0)
MCH: 30.7 pg (ref 26.0–34.0)
MCHC: 33.3 g/dL (ref 30.0–36.0)
MCV: 92.1 fL (ref 80.0–100.0)
Platelets: 249 10*3/uL (ref 150–400)
RBC: 3.55 MIL/uL — ABNORMAL LOW (ref 4.22–5.81)
RDW: 12.8 % (ref 11.5–15.5)
WBC: 10.5 10*3/uL (ref 4.0–10.5)
nRBC: 0 % (ref 0.0–0.2)

## 2020-05-24 SURGERY — OPEN REDUCTION INTERNAL FIXATION (ORIF) ULNAR FRACTURE
Anesthesia: Regional | Site: Arm Lower | Laterality: Right

## 2020-05-24 MED ORDER — ONDANSETRON HCL 4 MG/2ML IJ SOLN
INTRAMUSCULAR | Status: AC
  Filled 2020-05-24: qty 2

## 2020-05-24 MED ORDER — CHLORHEXIDINE GLUCONATE 0.12 % MT SOLN: 15.0000 mL | Freq: Once | OROMUCOSAL | Status: AC

## 2020-05-24 MED ORDER — CEFAZOLIN SODIUM-DEXTROSE 2-4 GM/100ML-% IV SOLN
2.0000 g | Freq: Three times a day (TID) | INTRAVENOUS | Status: AC
  Administered 2020-05-24 – 2020-05-25 (×3): 2 g via INTRAVENOUS
  Filled 2020-05-24 (×4): qty 100

## 2020-05-24 MED ORDER — LIDOCAINE 2% (20 MG/ML) 5 ML SYRINGE
INTRAMUSCULAR | Status: AC
  Filled 2020-05-24: qty 5

## 2020-05-24 MED ORDER — LIDOCAINE 2% (20 MG/ML) 5 ML SYRINGE
INTRAMUSCULAR | Status: DC | PRN
  Administered 2020-05-24: 20 mg via INTRAVENOUS

## 2020-05-24 MED ORDER — HYDROMORPHONE HCL 1 MG/ML IJ SOLN
0.2500 mg | INTRAMUSCULAR | Status: DC | PRN
  Administered 2020-05-24 – 2020-05-25 (×5): 1 mg via INTRAVENOUS
  Filled 2020-05-24 (×5): qty 1

## 2020-05-24 MED ORDER — FENTANYL CITRATE (PF) 250 MCG/5ML IJ SOLN
INTRAMUSCULAR | Status: AC
  Filled 2020-05-24: qty 5

## 2020-05-24 MED ORDER — CHLORHEXIDINE GLUCONATE 0.12 % MT SOLN
OROMUCOSAL | Status: AC
  Administered 2020-05-24: 15 mL via OROMUCOSAL
  Filled 2020-05-24: qty 15

## 2020-05-24 MED ORDER — METHOCARBAMOL 750 MG PO TABS
750.0000 mg | ORAL_TABLET | Freq: Four times a day (QID) | ORAL | Status: DC
  Administered 2020-05-24 – 2020-05-25 (×2): 750 mg via ORAL
  Filled 2020-05-24 (×2): qty 1

## 2020-05-24 MED ORDER — DEXAMETHASONE SODIUM PHOSPHATE 10 MG/ML IJ SOLN
INTRAMUSCULAR | Status: AC
  Filled 2020-05-24: qty 1

## 2020-05-24 MED ORDER — DIPHENHYDRAMINE HCL 25 MG PO CAPS
25.0000 mg | ORAL_CAPSULE | Freq: Four times a day (QID) | ORAL | Status: DC | PRN
  Administered 2020-05-24: 25 mg via ORAL
  Filled 2020-05-24: qty 1

## 2020-05-24 MED ORDER — BUPIVACAINE-EPINEPHRINE (PF) 0.5% -1:200000 IJ SOLN
INTRAMUSCULAR | Status: DC | PRN
  Administered 2020-05-24: 30 mL via PERINEURAL

## 2020-05-24 MED ORDER — HYDROMORPHONE HCL 1 MG/ML IJ SOLN: 0.2500 mg | INTRAMUSCULAR | Status: DC | PRN

## 2020-05-24 MED ORDER — PROPOFOL 10 MG/ML IV BOLUS
INTRAVENOUS | Status: DC | PRN
  Administered 2020-05-24: 10 mg via INTRAVENOUS
  Administered 2020-05-24: 20 mg via INTRAVENOUS
  Administered 2020-05-24: 50 mg via INTRAVENOUS

## 2020-05-24 MED ORDER — GLYCOPYRROLATE PF 0.2 MG/ML IJ SOSY
PREFILLED_SYRINGE | INTRAMUSCULAR | Status: AC
  Filled 2020-05-24: qty 1

## 2020-05-24 MED ORDER — PROPOFOL 10 MG/ML IV BOLUS
INTRAVENOUS | Status: AC
  Filled 2020-05-24: qty 20

## 2020-05-24 MED ORDER — ONDANSETRON HCL 4 MG/2ML IJ SOLN: 4.0000 mg | Freq: Once | INTRAMUSCULAR | Status: DC | PRN

## 2020-05-24 MED ORDER — FENTANYL CITRATE (PF) 250 MCG/5ML IJ SOLN
INTRAMUSCULAR | Status: DC | PRN
Start: 2020-05-24 — End: 2020-05-24
  Administered 2020-05-24: 50 ug via INTRAVENOUS
  Administered 2020-05-24 (×2): 25 ug via INTRAVENOUS

## 2020-05-24 MED ORDER — MIDAZOLAM HCL 2 MG/2ML IJ SOLN
INTRAMUSCULAR | Status: AC
  Filled 2020-05-24: qty 2

## 2020-05-24 MED ORDER — DEXAMETHASONE SODIUM PHOSPHATE 10 MG/ML IJ SOLN
INTRAMUSCULAR | Status: DC | PRN
  Administered 2020-05-24: 5 mg via INTRAVENOUS

## 2020-05-24 MED ORDER — LACTATED RINGERS IV SOLN: INTRAVENOUS | Status: DC

## 2020-05-24 MED ORDER — ONDANSETRON HCL 4 MG/2ML IJ SOLN
INTRAMUSCULAR | Status: DC | PRN
  Administered 2020-05-24: 4 mg via INTRAVENOUS

## 2020-05-24 MED ORDER — MIDAZOLAM HCL 5 MG/5ML IJ SOLN
INTRAMUSCULAR | Status: DC | PRN
  Administered 2020-05-24: 2 mg via INTRAVENOUS

## 2020-05-24 MED ORDER — MEPERIDINE HCL 25 MG/ML IJ SOLN: 6.2500 mg | INTRAMUSCULAR | Status: DC | PRN

## 2020-05-24 MED ORDER — PROPOFOL 500 MG/50ML IV EMUL
INTRAVENOUS | Status: DC | PRN
  Administered 2020-05-24: 100 ug/kg/min via INTRAVENOUS

## 2020-05-24 MED ORDER — 0.9 % SODIUM CHLORIDE (POUR BTL) OPTIME
TOPICAL | Status: DC | PRN
  Administered 2020-05-24: 1000 mL

## 2020-05-24 MED ORDER — HYDROMORPHONE HCL 1 MG/ML IJ SOLN
2.0000 mg | Freq: Once | INTRAMUSCULAR | Status: AC
  Administered 2020-05-24: 2 mg via INTRAVENOUS
  Filled 2020-05-24: qty 2

## 2020-05-24 MED ORDER — GLYCOPYRROLATE 0.2 MG/ML IJ SOLN
INTRAMUSCULAR | Status: DC | PRN
  Administered 2020-05-24: .2 mg via INTRAVENOUS

## 2020-05-24 SURGICAL SUPPLY — 69 items
BIT DRILL 2.8 QUICK RELEASE (BIT) IMPLANT
BLADE CLIPPER SURG (BLADE) ×3 IMPLANT
BNDG CMPR 9X4 STRL LF SNTH (GAUZE/BANDAGES/DRESSINGS) ×1
BNDG ELASTIC 3X5.8 VLCR STR LF (GAUZE/BANDAGES/DRESSINGS) ×2 IMPLANT
BNDG ELASTIC 4X5.8 VLCR STR LF (GAUZE/BANDAGES/DRESSINGS) ×3 IMPLANT
BNDG ESMARK 4X9 LF (GAUZE/BANDAGES/DRESSINGS) ×3 IMPLANT
BNDG GAUZE ELAST 4 BULKY (GAUZE/BANDAGES/DRESSINGS) ×2 IMPLANT
BRUSH SCRUB EZ PLAIN DRY (MISCELLANEOUS) ×6 IMPLANT
COVER SURGICAL LIGHT HANDLE (MISCELLANEOUS) ×6 IMPLANT
COVER WAND RF STERILE (DRAPES) ×3 IMPLANT
DECANTER SPIKE VIAL GLASS SM (MISCELLANEOUS) IMPLANT
DRAPE C-ARMOR (DRAPES) ×3 IMPLANT
DRAPE OEC MINIVIEW 54X84 (DRAPES) ×3 IMPLANT
DRAPE SURG 17X23 STRL (DRAPES) ×4 IMPLANT
DRILL 2.8 QUICK RELEASE (BIT) ×3
DRSG EMULSION OIL 3X3 NADH (GAUZE/BANDAGES/DRESSINGS) ×3 IMPLANT
DRSG MEPILEX BORDER 4X4 (GAUZE/BANDAGES/DRESSINGS) ×3 IMPLANT
DRSG MEPITEL 4X7.2 (GAUZE/BANDAGES/DRESSINGS) ×2 IMPLANT
ELECT REM PT RETURN 9FT ADLT (ELECTROSURGICAL) ×3
ELECTRODE REM PT RTRN 9FT ADLT (ELECTROSURGICAL) ×1 IMPLANT
GAUZE SPONGE 4X4 12PLY STRL (GAUZE/BANDAGES/DRESSINGS) ×3 IMPLANT
GAUZE SPONGE 4X4 12PLY STRL LF (GAUZE/BANDAGES/DRESSINGS) ×2 IMPLANT
GLOVE BIO SURGEON STRL SZ7.5 (GLOVE) ×3 IMPLANT
GLOVE BIO SURGEON STRL SZ8 (GLOVE) ×3 IMPLANT
GLOVE BIOGEL PI IND STRL 7.5 (GLOVE) ×1 IMPLANT
GLOVE BIOGEL PI IND STRL 8 (GLOVE) ×1 IMPLANT
GLOVE BIOGEL PI INDICATOR 7.5 (GLOVE) ×2
GLOVE BIOGEL PI INDICATOR 8 (GLOVE) ×2
GOWN STRL REUS W/ TWL LRG LVL3 (GOWN DISPOSABLE) ×2 IMPLANT
GOWN STRL REUS W/ TWL XL LVL3 (GOWN DISPOSABLE) ×1 IMPLANT
GOWN STRL REUS W/TWL LRG LVL3 (GOWN DISPOSABLE) ×6
GOWN STRL REUS W/TWL XL LVL3 (GOWN DISPOSABLE) ×3
KIT BASIN OR (CUSTOM PROCEDURE TRAY) ×3 IMPLANT
KIT TURNOVER KIT B (KITS) ×3 IMPLANT
MANIFOLD NEPTUNE II (INSTRUMENTS) ×3 IMPLANT
NDL HYPO 25GX1X1/2 BEV (NEEDLE) IMPLANT
NEEDLE HYPO 25GX1X1/2 BEV (NEEDLE) IMPLANT
NS IRRIG 1000ML POUR BTL (IV SOLUTION) ×3 IMPLANT
PACK ORTHO EXTREMITY (CUSTOM PROCEDURE TRAY) ×3 IMPLANT
PAD ARMBOARD 7.5X6 YLW CONV (MISCELLANEOUS) ×6 IMPLANT
PAD CAST 3X4 CTTN HI CHSV (CAST SUPPLIES) ×1 IMPLANT
PADDING CAST COTTON 3X4 STRL (CAST SUPPLIES) ×3
PADDING CAST SYNTHETIC 4 (CAST SUPPLIES) ×2
PADDING CAST SYNTHETIC 4X4 STR (CAST SUPPLIES) IMPLANT
PLATE ULNA 12 HOLE (Plate) ×2 IMPLANT
PLATE ULNA 14-HOLE (Plate) ×2 IMPLANT
SCREW HEXALOBE LOCK 3.5X22MM (Screw) ×2 IMPLANT
SCREW HEXALOBE LOCKING 3.5X16M (Screw) ×3 IMPLANT
SCREW HEXALOBE NON-LOCK 3.5X14 (Screw) ×2 IMPLANT
SCREW NON LOCKING HEX 3.5X18MM (Screw) ×4 IMPLANT
SCREW NON LOCKING HEX 3.5X22 (Screw) ×4 IMPLANT
SLING ARM IMMOBILIZER LRG (SOFTGOODS) ×2 IMPLANT
SPLINT FIBERGLASS 3X12 (CAST SUPPLIES) ×3 IMPLANT
STAPLER VISISTAT 35W (STAPLE) ×3 IMPLANT
SUCTION FRAZIER HANDLE 10FR (MISCELLANEOUS) ×3
SUCTION TUBE FRAZIER 10FR DISP (MISCELLANEOUS) ×1 IMPLANT
SUT ETHILON 2 0 FS 18 (SUTURE) ×4 IMPLANT
SUT PDS AB 2-0 CT1 27 (SUTURE) ×2 IMPLANT
SUT PROLENE 0 CT (SUTURE) IMPLANT
SUT VIC AB 2-0 CT1 27 (SUTURE) ×3
SUT VIC AB 2-0 CT1 TAPERPNT 27 (SUTURE) ×1 IMPLANT
SUT VIC AB 2-0 CT3 27 (SUTURE) IMPLANT
SYR CONTROL 10ML LL (SYRINGE) IMPLANT
TOWEL GREEN STERILE (TOWEL DISPOSABLE) ×6 IMPLANT
TOWEL GREEN STERILE FF (TOWEL DISPOSABLE) ×3 IMPLANT
TUBE CONNECTING 12'X1/4 (SUCTIONS) ×1
TUBE CONNECTING 12X1/4 (SUCTIONS) ×2 IMPLANT
UNDERPAD 30X36 HEAVY ABSORB (UNDERPADS AND DIAPERS) ×3 IMPLANT
WATER STERILE IRR 1000ML POUR (IV SOLUTION) ×3 IMPLANT

## 2020-05-24 NOTE — Anesthesia Preprocedure Evaluation (Signed)
Anesthesia Evaluation  Patient identified by MRN, date of birth, ID band Patient awake    Reviewed: Allergy & Precautions, NPO status , Patient's Chart, lab work & pertinent test results  Airway Mallampati: I  TM Distance: >3 FB Neck ROM: Full    Dental   Pulmonary    Pulmonary exam normal        Cardiovascular Normal cardiovascular exam     Neuro/Psych    GI/Hepatic   Endo/Other    Renal/GU      Musculoskeletal   Abdominal   Peds  Hematology   Anesthesia Other Findings   Reproductive/Obstetrics                             Anesthesia Physical Anesthesia Plan  ASA: II  Anesthesia Plan: Regional   Post-op Pain Management:    Induction: Intravenous  PONV Risk Score and Plan: Treatment may vary due to age or medical condition  Airway Management Planned: Nasal Cannula  Additional Equipment:   Intra-op Plan:   Post-operative Plan:   Informed Consent: I have reviewed the patients History and Physical, chart, labs and discussed the procedure including the risks, benefits and alternatives for the proposed anesthesia with the patient or authorized representative who has indicated his/her understanding and acceptance.       Plan Discussed with: CRNA and Surgeon  Anesthesia Plan Comments:         Anesthesia Quick Evaluation
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
## 2020-05-24 NOTE — Anesthesia Postprocedure Evaluation (Signed)
Anesthesia Post Note  Patient: Billy Bailey  Procedure(s) Performed: OPEN REDUCTION INTERNAL FIXATION (ORIF) ULNAR FRACTURE (Right Arm Lower) IRRIGATION AND DEBRIDEMENT EXTREMITY (Right Arm Lower)     Patient location during evaluation: PACU Anesthesia Type: Regional Level of consciousness: awake and alert and patient cooperative Pain management: pain level controlled Vital Signs Assessment: post-procedure vital signs reviewed and stable Respiratory status: spontaneous breathing and respiratory function stable Cardiovascular status: stable Anesthetic complications: no   No complications documented.  Last Vitals:  Vitals:   05/24/20 1148 05/24/20 1203  BP: 109/69 121/67  Pulse: 83 73  Resp: 15 16  Temp:    SpO2: 96% 92%    Last Pain:  Vitals:   05/24/20 1148  TempSrc:   PainSc: 0-No pain                 Aviraj Kentner DAVID

## 2020-05-24 NOTE — Anesthesia Procedure Notes (Addendum)
Anesthesia Regional Block: Supraclavicular block   Pre-Anesthetic Checklist: ,, timeout performed, Correct Patient, Correct Site, Correct Laterality, Correct Procedure, Correct Position, site marked, Risks and benefits discussed,  Surgical consent,  Pre-op evaluation,  At surgeon's request and post-op pain management  Laterality: Right  Prep: chloraprep       Needles:   Needle Type: Echogenic Stimulator Needle     Needle Length: 9cm  Needle Gauge: 21     Additional Needles:   Procedures:, nerve stimulator,,,,,,,   Nerve Stimulator or Paresthesia:  Response: 0.4 mA,   Additional Responses:   Narrative:  Start time: 05/24/2020 8:25 AM End time: 05/24/2020 8:35 AM Injection made incrementally with aspirations every 5 mL.  Performed by: Personally  Anesthesiologist: Arta Bruce, MD  Additional Notes: Monitors applied. Patient sedated. Sterile prep and drape,hand hygiene and sterile gloves were used. Relevant anatomy identified.Needle position confirmed.Local anesthetic injected incrementally after negative aspiration. Local anesthetic spread visualized around nerve(s). Vascular puncture avoided. No complications. Image printed for medical record.The patient tolerated the procedure well.

## 2020-05-24 NOTE — Transfer of Care (Signed)
Immediate Anesthesia Transfer of Care Note  Patient: Billy Bailey  Procedure(s) Performed: OPEN REDUCTION INTERNAL FIXATION (ORIF) ULNAR FRACTURE (Right Arm Lower) IRRIGATION AND DEBRIDEMENT EXTREMITY (Right Arm Lower)  Patient Location: PACU  Anesthesia Type:MAC combined with regional for post-op pain  Level of Consciousness: awake, alert , oriented and patient cooperative  Airway & Oxygen Therapy: Patient Spontanous Breathing and Patient connected to face mask oxygen  Post-op Assessment: Report given to RN and Post -op Vital signs reviewed and stable  Post vital signs: Reviewed and stable  Last Vitals:  Vitals Value Taken Time  BP 128/65 05/24/20 1103  Temp 36.5 C 05/24/20 1103  Pulse 78 05/24/20 1103  Resp 14 05/24/20 1103  SpO2 100 % 05/24/20 1103    Last Pain:  Vitals:   05/24/20 0556  TempSrc: Oral  PainSc:       Patients Stated Pain Goal: 0 (95/28/41 3244)  Complications: No complications documented.

## 2020-05-25 ENCOUNTER — Inpatient Hospital Stay (HOSPITAL_COMMUNITY): Payer: Medicaid Other

## 2020-05-25 ENCOUNTER — Encounter (HOSPITAL_COMMUNITY): Payer: Self-pay

## 2020-05-25 DIAGNOSIS — S27329A Contusion of lung, unspecified, initial encounter: Secondary | ICD-10-CM | POA: Diagnosis not present

## 2020-05-25 DIAGNOSIS — S2242XA Multiple fractures of ribs, left side, initial encounter for closed fracture: Secondary | ICD-10-CM | POA: Diagnosis not present

## 2020-05-25 DIAGNOSIS — S27321A Contusion of lung, unilateral, initial encounter: Secondary | ICD-10-CM | POA: Diagnosis not present

## 2020-05-25 DIAGNOSIS — S52231B Displaced oblique fracture of shaft of right ulna, initial encounter for open fracture type I or II: Secondary | ICD-10-CM | POA: Diagnosis present

## 2020-05-25 DIAGNOSIS — S21302A Unspecified open wound of left front wall of thorax with penetration into thoracic cavity, initial encounter: Secondary | ICD-10-CM | POA: Diagnosis not present

## 2020-05-25 DIAGNOSIS — S52201A Unspecified fracture of shaft of right ulna, initial encounter for closed fracture: Secondary | ICD-10-CM | POA: Diagnosis not present

## 2020-05-25 DIAGNOSIS — S52232B Displaced oblique fracture of shaft of left ulna, initial encounter for open fracture type I or II: Secondary | ICD-10-CM

## 2020-05-25 DIAGNOSIS — D62 Acute posthemorrhagic anemia: Secondary | ICD-10-CM | POA: Diagnosis not present

## 2020-05-25 HISTORY — DX: Displaced oblique fracture of shaft of left ulna, initial encounter for open fracture type I or II: S52.232B

## 2020-05-25 LAB — CBC
HCT: 32 % — ABNORMAL LOW (ref 39.0–52.0)
Hemoglobin: 10.5 g/dL — ABNORMAL LOW (ref 13.0–17.0)
MCH: 30.3 pg (ref 26.0–34.0)
MCHC: 32.8 g/dL (ref 30.0–36.0)
MCV: 92.5 fL (ref 80.0–100.0)
Platelets: 273 10*3/uL (ref 150–400)
RBC: 3.46 MIL/uL — ABNORMAL LOW (ref 4.22–5.81)
RDW: 12.6 % (ref 11.5–15.5)
WBC: 11.1 10*3/uL — ABNORMAL HIGH (ref 4.0–10.5)
nRBC: 0 % (ref 0.0–0.2)

## 2020-05-25 MED ORDER — ACETAMINOPHEN 500 MG PO TABS
1000.0000 mg | ORAL_TABLET | Freq: Four times a day (QID) | ORAL | Status: DC
  Administered 2020-05-25 – 2020-05-26 (×6): 1000 mg via ORAL
  Filled 2020-05-25 (×6): qty 2

## 2020-05-25 MED ORDER — GABAPENTIN 100 MG PO CAPS
100.0000 mg | ORAL_CAPSULE | Freq: Three times a day (TID) | ORAL | Status: DC
  Administered 2020-05-25 (×3): 100 mg via ORAL
  Filled 2020-05-25 (×4): qty 1

## 2020-05-25 MED ORDER — METHOCARBAMOL 500 MG PO TABS
1000.0000 mg | ORAL_TABLET | Freq: Four times a day (QID) | ORAL | Status: DC
  Administered 2020-05-25 – 2020-05-26 (×5): 1000 mg via ORAL
  Filled 2020-05-25 (×5): qty 2

## 2020-05-25 MED ORDER — DIPHENHYDRAMINE HCL 25 MG PO CAPS
25.0000 mg | ORAL_CAPSULE | Freq: Four times a day (QID) | ORAL | Status: DC | PRN
  Administered 2020-05-25: 50 mg via ORAL
  Filled 2020-05-25 (×2): qty 2
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
## 2020-05-25 NOTE — Plan of Care (Signed)
  Problem: Education: Goal: Verbalization of understanding the information provided will improve Outcome: Progressing   
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
## 2020-05-26 ENCOUNTER — Encounter (HOSPITAL_COMMUNITY): Payer: Self-pay | Admitting: Orthopedic Surgery

## 2020-05-26 DIAGNOSIS — D62 Acute posthemorrhagic anemia: Secondary | ICD-10-CM | POA: Diagnosis not present

## 2020-05-26 DIAGNOSIS — S2242XA Multiple fractures of ribs, left side, initial encounter for closed fracture: Secondary | ICD-10-CM | POA: Diagnosis not present

## 2020-05-26 DIAGNOSIS — S27321A Contusion of lung, unilateral, initial encounter: Secondary | ICD-10-CM | POA: Diagnosis not present

## 2020-05-26 DIAGNOSIS — S21302A Unspecified open wound of left front wall of thorax with penetration into thoracic cavity, initial encounter: Secondary | ICD-10-CM | POA: Diagnosis not present

## 2020-05-26 LAB — BASIC METABOLIC PANEL
Anion gap: 7 (ref 5–15)
BUN: 13 mg/dL (ref 6–20)
CO2: 28 mmol/L (ref 22–32)
Calcium: 8.7 mg/dL — ABNORMAL LOW (ref 8.9–10.3)
Chloride: 104 mmol/L (ref 98–111)
Creatinine, Ser: 0.95 mg/dL (ref 0.61–1.24)
GFR calc Af Amer: >60 mL/min (ref >60)
GFR calc non Af Amer: >60 mL/min (ref >60)
Glucose, Bld: 97 mg/dL (ref 70–99)
Potassium: 3.7 mmol/L (ref 3.5–5.1)
Sodium: 139 mmol/L (ref 135–145)

## 2020-05-26 LAB — CBC
HCT: 29.7 % — ABNORMAL LOW (ref 39.0–52.0)
Hemoglobin: 9.7 g/dL — ABNORMAL LOW (ref 13.0–17.0)
MCH: 30.6 pg (ref 26.0–34.0)
MCHC: 32.7 g/dL (ref 30.0–36.0)
MCV: 93.7 fL (ref 80.0–100.0)
Platelets: 263 10*3/uL (ref 150–400)
RBC: 3.17 MIL/uL — ABNORMAL LOW (ref 4.22–5.81)
RDW: 12.7 % (ref 11.5–15.5)
WBC: 9 10*3/uL (ref 4.0–10.5)
nRBC: 0 % (ref 0.0–0.2)

## 2020-05-26 LAB — VITAMIN D 25 HYDROXY (VIT D DEFICIENCY, FRACTURES): Vit D, 25-Hydroxy: 12.1 ng/mL — ABNORMAL LOW (ref 30–100)

## 2020-05-26 MED ORDER — GABAPENTIN 100 MG PO CAPS
200.0000 mg | ORAL_CAPSULE | Freq: Three times a day (TID) | ORAL | 0 refills | Status: DC
Start: 2020-05-26 — End: 2022-05-20

## 2020-05-26 MED ORDER — POLYETHYLENE GLYCOL 3350 17 G PO PACK: 17.0000 g | PACK | Freq: Every day | ORAL | 0 refills | Status: DC | PRN

## 2020-05-26 MED ORDER — ACETAMINOPHEN 500 MG PO TABS: 1000.0000 mg | ORAL_TABLET | Freq: Four times a day (QID) | ORAL | 0 refills | Status: AC | PRN

## 2020-05-26 MED ORDER — METHOCARBAMOL 500 MG PO TABS: 1000.0000 mg | ORAL_TABLET | Freq: Four times a day (QID) | ORAL | 0 refills | Status: DC | PRN

## 2020-05-26 MED ORDER — GABAPENTIN 100 MG PO CAPS
200.0000 mg | ORAL_CAPSULE | Freq: Three times a day (TID) | ORAL | Status: DC
  Administered 2020-05-26 (×2): 200 mg via ORAL
  Filled 2020-05-26 (×2): qty 2

## 2020-05-26 MED ORDER — OXYCODONE HCL 10 MG PO TABS
10.0000 mg | ORAL_TABLET | Freq: Four times a day (QID) | ORAL | 0 refills | Status: DC | PRN
Start: 2020-05-26 — End: 2022-05-20

## 2020-05-26 MED ORDER — MORPHINE SULFATE (PF) 2 MG/ML IV SOLN: 2.0000 mg | INTRAVENOUS | Status: DC | PRN

## 2020-05-26 MED ORDER — OXYCODONE HCL 5 MG PO TABS
10.0000 mg | ORAL_TABLET | ORAL | Status: DC | PRN
  Administered 2020-05-26 (×2): 15 mg via ORAL
  Filled 2020-05-26 (×2): qty 3
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
## 2020-05-26 NOTE — Progress Notes (Signed)
Discharge instructions reviewed with pt. Copy of instructions given to pt, informed scripts sent to his pharmacy, other meds listed over the counter..pt verbalized understanding.  Pt d/c'd via wheelchair with belongings, with his ride at the front entrance waiting for him pt stated.          Escorted by unit NT.

## 2020-05-27 LAB — MRSA CULTURE: Culture: NOT DETECTED
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
## 2020-06-11 DIAGNOSIS — S42101D Fracture of unspecified part of scapula, right shoulder, subsequent encounter for fracture with routine healing: Secondary | ICD-10-CM | POA: Diagnosis not present

## 2020-06-11 DIAGNOSIS — S52001E Unspecified fracture of upper end of right ulna, subsequent encounter for open fracture type I or II with routine healing: Secondary | ICD-10-CM | POA: Diagnosis not present

## 2020-06-25 DIAGNOSIS — S52001E Unspecified fracture of upper end of right ulna, subsequent encounter for open fracture type I or II with routine healing: Secondary | ICD-10-CM | POA: Diagnosis not present

## 2020-06-25 DIAGNOSIS — S42101D Fracture of unspecified part of scapula, right shoulder, subsequent encounter for fracture with routine healing: Secondary | ICD-10-CM | POA: Diagnosis not present

## 2021-01-04 IMAGING — DX DG CHEST 1V PORT
1 series · 1 of 1 positions shown · non-contrast
Comparison: None.

CLINICAL DATA: Slurred speech

EXAM:
PORTABLE CHEST 1 VIEW

[chest ap]
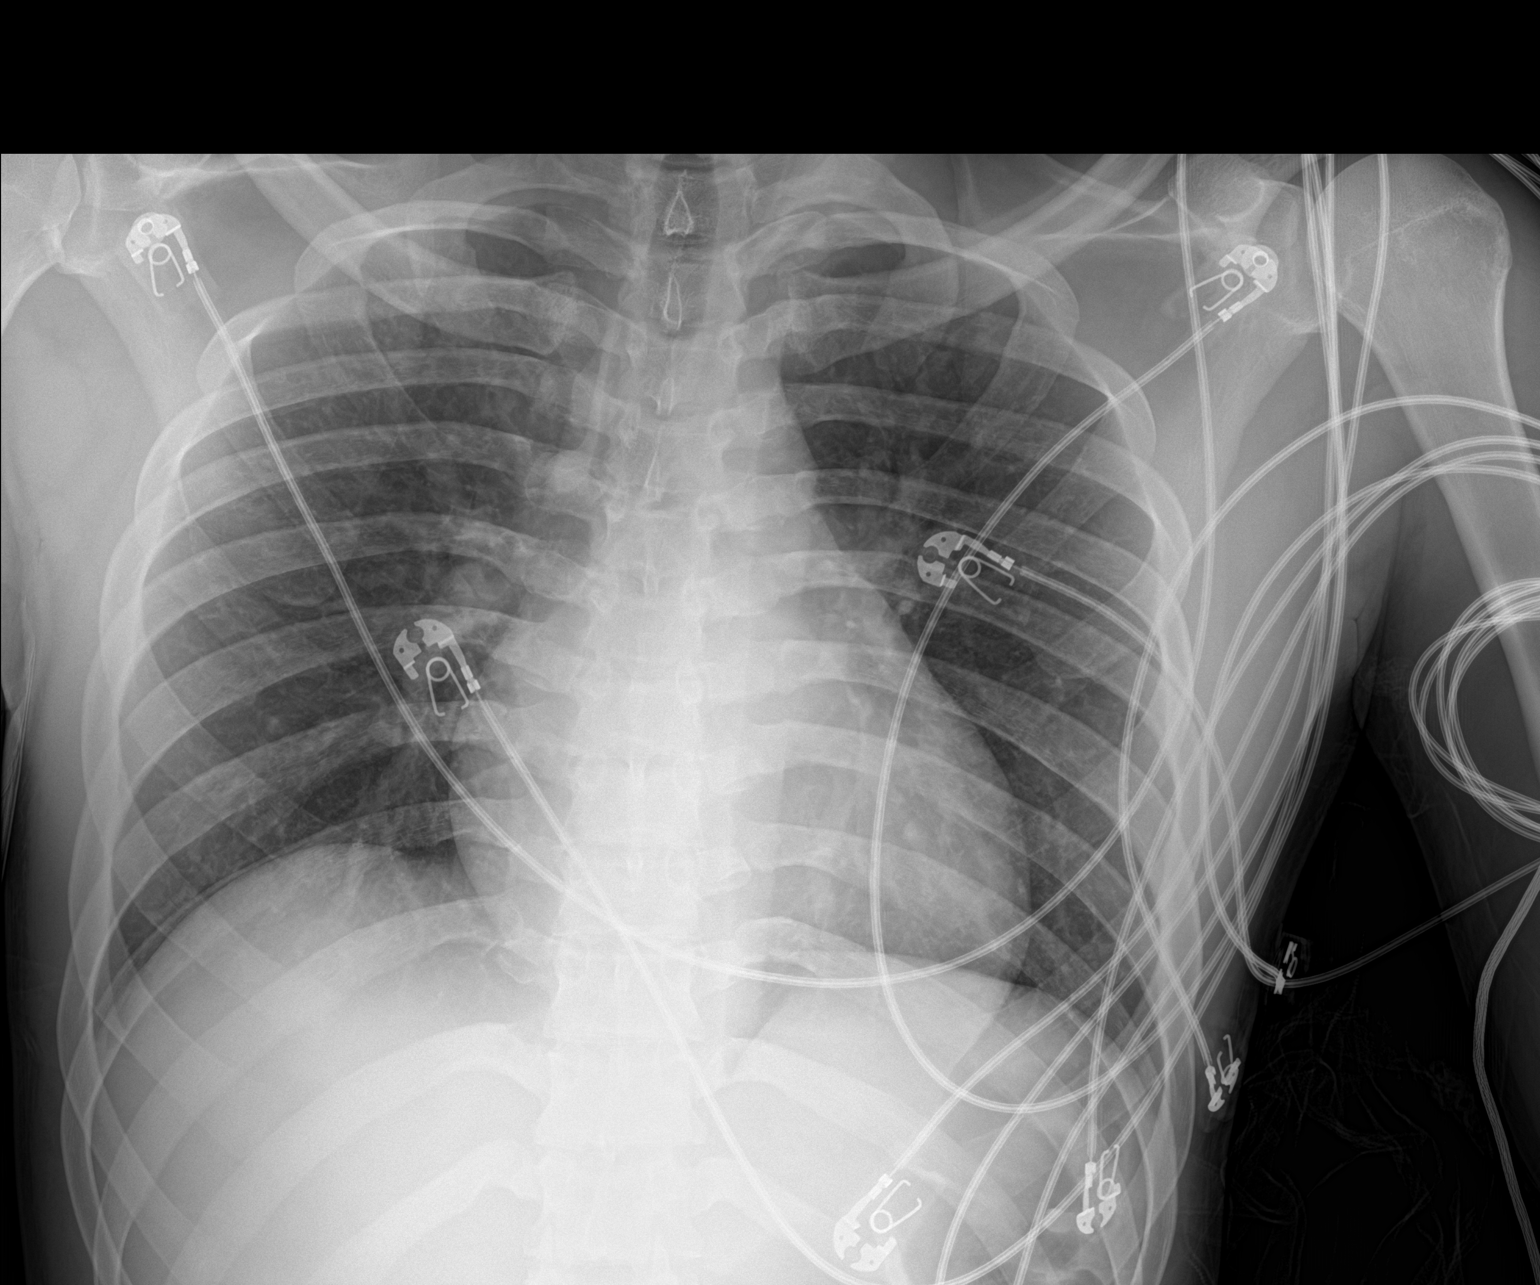

[1 of 1 positions shown; findings below may reference images not displayed]

FINDINGS: The heart size and mediastinal contours are within normal limits.
Both lungs are clear. The visualized skeletal structures are
unremarkable.
IMPRESSION: No active disease.
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
## 2021-01-14 DIAGNOSIS — G5610 Other lesions of median nerve, unspecified upper limb: Secondary | ICD-10-CM | POA: Diagnosis not present

## 2021-01-14 DIAGNOSIS — S52001E Unspecified fracture of upper end of right ulna, subsequent encounter for open fracture type I or II with routine healing: Secondary | ICD-10-CM | POA: Diagnosis not present

## 2021-01-14 DIAGNOSIS — S42101D Fracture of unspecified part of scapula, right shoulder, subsequent encounter for fracture with routine healing: Secondary | ICD-10-CM | POA: Diagnosis not present

## 2021-08-04 ENCOUNTER — Other Ambulatory Visit: Payer: Self-pay

## 2021-08-04 ENCOUNTER — Ambulatory Visit (HOSPITAL_COMMUNITY)
Admission: EM | Admit: 2021-08-04 | Discharge: 2021-08-04 | Disposition: A | Discharge status: Home / Self Care | Payer: Medicaid Other | Attending: Urgent Care | Admitting: Urgent Care

## 2021-08-04 ENCOUNTER — Encounter (HOSPITAL_COMMUNITY): Payer: Self-pay | Admitting: Emergency Medicine

## 2021-08-04 DIAGNOSIS — R369 Urethral discharge, unspecified: Secondary | ICD-10-CM | POA: Insufficient documentation

## 2021-08-04 DIAGNOSIS — Z7251 High risk heterosexual behavior: Secondary | ICD-10-CM | POA: Insufficient documentation

## 2021-08-04 LAB — POCT URINALYSIS DIPSTICK, ED / UC
Bilirubin Urine: NEGATIVE
Glucose, UA: NEGATIVE mg/dL
Nitrite: NEGATIVE
Protein, ur: 30 mg/dL — AB
Specific Gravity, Urine: 1.025 (ref 1.005–1.030)
Urobilinogen, UA: 1 mg/dL (ref 0.0–1.0)
pH: 7 (ref 5.0–8.0)

## 2021-08-04 LAB — HIV ANTIBODY (ROUTINE TESTING W REFLEX): HIV Screen 4th Generation wRfx: NONREACTIVE

## 2021-08-04 MED ORDER — DOXYCYCLINE HYCLATE 100 MG PO CAPS: 100.0000 mg | ORAL_CAPSULE | Freq: Two times a day (BID) | ORAL | 0 refills | Status: DC

## 2021-08-04 MED ORDER — CEFTRIAXONE SODIUM 500 MG IJ SOLR
INTRAMUSCULAR | Status: AC
  Filled 2021-08-04: qty 500

## 2021-08-04 MED ORDER — LIDOCAINE HCL (PF) 1 % IJ SOLN
INTRAMUSCULAR | Status: AC
  Filled 2021-08-04: qty 2

## 2021-08-04 MED ORDER — CEFTRIAXONE SODIUM 500 MG IJ SOLR
500.0000 mg | Freq: Once | INTRAMUSCULAR | Status: AC
  Administered 2021-08-04: 500 mg via INTRAMUSCULAR

## 2021-08-04 NOTE — ED Triage Notes (Signed)
Pt presents with blood in urine, dysuria, and lower abdominal pain xs 1 -2 weeks.

## 2021-08-04 NOTE — Discharge Instructions (Addendum)
Avoid all forms of sexual intercourse (oral, vaginal, anal) for the next 7 days to avoid spreading/reinfecting or at least until we can see what kinds of infection results are positive. Return if symptoms worsen/do not resolve, you develop fever, abdominal pain, blood in your urine, or are re-exposed to a sexually transmitted infection (STI).  

## 2021-08-04 NOTE — ED Provider Notes (Signed)
[place]   MRN: [id number] DOB: [birth year]  Subjective:   Billy Bailey is a 20 y.o. male presenting for 2-week history of persistent dysuria, lower abdominal pain, penile discharge.  Patient has unprotected sex with multiple male partners. Denies hematuria, urinary frequency, penile swelling, testicular pain, testicular swelling, anal pain, groin pain.   No current facility-administered medications for this encounter.  Current Outpatient Medications:    acetaminophen (TYLENOL) 500 MG tablet, Take 2 tablets (1,000 mg total) by mouth every 6 (six) hours as needed for mild pain., Disp: 30 tablet, Rfl: 0   gabapentin (NEURONTIN) 100 MG capsule, Take 2 capsules (200 mg total) by mouth 3 (three) times daily., Disp: 120 capsule, Rfl: 0   ibuprofen (ADVIL,MOTRIN) 600 MG tablet, Take 1 tablet (600 mg total) by mouth every 6 (six) hours as needed., Disp: 30 tablet, Rfl: 0   methocarbamol (ROBAXIN) 500 MG tablet, Take 2 tablets (1,000 mg total) by mouth every 6 (six) hours as needed for muscle spasms., Disp: 30 tablet, Rfl: 0   metroNIDAZOLE (FLAGYL) 500 MG tablet, Take 1 tablet (500 mg total) by mouth 2 (two) times daily. One po bid x 7 days, Disp: 14 tablet, Rfl: 0   naproxen (NAPROSYN) 500 MG tablet, Take 1 tablet (500 mg total) by mouth 2 (two) times daily., Disp: 30 tablet, Rfl: 0   oxyCODONE 10 MG TABS, Take 1-1.5 tablets (10-15 mg total) by mouth every 6 (six) hours as needed for moderate pain or severe pain (10mg  for moderate pain, 15mg  for severe pain)., Disp: 40 tablet, Rfl: 0   polyethylene glycol (MIRALAX / GLYCOLAX) 17 g packet, Take 17 g by mouth daily as needed for mild constipation., Disp: 14 each, Rfl: 0   polyethylene glycol powder (GLYCOLAX/MIRALAX) 17 GM/SCOOP powder, Take 17 g by mouth 2 (two) times daily as needed for mild constipation., Disp: 3350 g, Rfl: 1   No Known Allergies  Past Medical History:  Diagnosis Date   Open displaced oblique  fracture of shaft of left ulna 05/25/2020   Smoking 2020     Past Surgical History:  Procedure Laterality Date   HIP SURGERY     I & D EXTREMITY Right 05/24/2020   Procedure: IRRIGATION AND DEBRIDEMENT EXTREMITY;  Surgeon: Altamese Beulah, MD;  Location: [facility];  Service: Orthopedics;  Laterality: Right;   ORIF ULNAR FRACTURE Right 05/24/2020   Procedure: OPEN REDUCTION INTERNAL FIXATION (ORIF) ULNAR FRACTURE;  Surgeon: Altamese Gays, MD;  Location: [facility];  Service: Orthopedics;  Laterality: Right;    History reviewed. No pertinent family history.  Social History   Tobacco Use   Smoking status: Light Smoker   Smokeless tobacco: Never  Substance Use Topics   Alcohol use: No   Drug use: No    ROS   Objective:   Vitals: BP 138/79 (BP Location: Left Arm)   Pulse 64   Temp 98.6 F (37 C) (Oral)   Resp 16   SpO2 98%   Physical Exam Constitutional:      General: He is not in acute distress.    Appearance: Normal appearance. He is well-developed and normal weight. He is not ill-appearing, toxic-appearing or diaphoretic.  HENT:     Head: Normocephalic and atraumatic.     Right Ear: External ear normal.     Left Ear: External ear normal.     Nose: Nose normal.     Mouth/Throat:     Pharynx: Oropharynx is clear.  Eyes:  General: No scleral icterus.       Right eye: No discharge.        Left eye: No discharge.     Extraocular Movements: Extraocular movements intact.     Pupils: Pupils are equal, round, and reactive to light.  Cardiovascular:     Rate and Rhythm: Normal rate.  Pulmonary:     Effort: Pulmonary effort is normal.  Genitourinary:    Penis: Circumcised. Discharge present. No phimosis, paraphimosis, hypospadias, erythema, tenderness, swelling or lesions.   Musculoskeletal:     Cervical back: Normal range of motion.  Neurological:     Mental Status: He is alert and oriented to person, place, and time.  Psychiatric:        Mood and Affect: Mood normal.         Behavior: Behavior normal.        Thought Content: Thought content normal.        Judgment: Judgment normal.    Assessment and Plan :   PDMP not reviewed this encounter.  1. Penile discharge   2. Unprotected sex    Patient treated empirically as per CDC guidelines with IM ceftriaxone, doxycycline as an outpatient.  Labs pending.   Counseled on safe sex practices including abstaining for 1 week following treatment.  Counseled patient on potential for adverse effects with medications prescribed/recommended today, ER and return-to-clinic precautions discussed, patient verbalized understanding.    Wallis Bamberg, PA-C 08/04/21 1524

## 2021-08-05 LAB — CYTOLOGY, (ORAL, ANAL, URETHRAL) ANCILLARY ONLY
Chlamydia: POSITIVE — AB
Comment: NEGATIVE
Comment: NEGATIVE
Comment: NORMAL
Neisseria Gonorrhea: POSITIVE — AB
Trichomonas: NEGATIVE

## 2021-08-05 LAB — RPR: RPR Ser Ql: NONREACTIVE

## 2021-08-31 DIAGNOSIS — S139XXA Sprain of joints and ligaments of unspecified parts of neck, initial encounter: Secondary | ICD-10-CM | POA: Diagnosis not present

## 2021-08-31 DIAGNOSIS — M542 Cervicalgia: Secondary | ICD-10-CM | POA: Diagnosis not present

## 2022-02-02 ENCOUNTER — Encounter: Payer: Self-pay | Admitting: *Deleted

## 2022-05-05 ENCOUNTER — Ambulatory Visit (HOSPITAL_COMMUNITY): Payer: Medicaid Other

## 2022-05-06 ENCOUNTER — Ambulatory Visit (HOSPITAL_COMMUNITY): Payer: Medicaid Other

## 2022-05-09 ENCOUNTER — Ambulatory Visit (HOSPITAL_COMMUNITY): Payer: Medicaid Other

## 2022-05-20 ENCOUNTER — Ambulatory Visit (HOSPITAL_COMMUNITY)
Admission: EM | Admit: 2022-05-20 | Discharge: 2022-05-20 | Disposition: A | Discharge status: Home / Self Care | Payer: Medicaid Other | Attending: Family Medicine | Admitting: Family Medicine

## 2022-05-20 ENCOUNTER — Encounter (HOSPITAL_COMMUNITY): Payer: Self-pay

## 2022-05-20 ENCOUNTER — Ambulatory Visit (INDEPENDENT_AMBULATORY_CARE_PROVIDER_SITE_OTHER): Payer: Medicaid Other

## 2022-05-20 DIAGNOSIS — R059 Cough, unspecified: Secondary | ICD-10-CM

## 2022-05-20 DIAGNOSIS — K649 Unspecified hemorrhoids: Secondary | ICD-10-CM | POA: Insufficient documentation

## 2022-05-20 DIAGNOSIS — R079 Chest pain, unspecified: Secondary | ICD-10-CM | POA: Diagnosis not present

## 2022-05-20 DIAGNOSIS — Z202 Contact with and (suspected) exposure to infections with a predominantly sexual mode of transmission: Secondary | ICD-10-CM | POA: Insufficient documentation

## 2022-05-20 DIAGNOSIS — J069 Acute upper respiratory infection, unspecified: Secondary | ICD-10-CM | POA: Insufficient documentation

## 2022-05-20 DIAGNOSIS — R0789 Other chest pain: Secondary | ICD-10-CM | POA: Insufficient documentation

## 2022-05-20 MED ORDER — BENZONATATE 100 MG PO CAPS: 100.0000 mg | ORAL_CAPSULE | Freq: Three times a day (TID) | ORAL | 0 refills | Status: AC | PRN

## 2022-05-20 MED ORDER — CEFTRIAXONE SODIUM 500 MG IJ SOLR
500.0000 mg | Freq: Once | INTRAMUSCULAR | Status: AC
  Administered 2022-05-20: 500 mg via INTRAMUSCULAR

## 2022-05-20 MED ORDER — IBUPROFEN 800 MG PO TABS
800.0000 mg | ORAL_TABLET | Freq: Three times a day (TID) | ORAL | 0 refills | Status: AC | PRN
Start: 2022-05-20 — End: ?

## 2022-05-20 MED ORDER — DOXYCYCLINE HYCLATE 100 MG PO CAPS: 100.0000 mg | ORAL_CAPSULE | Freq: Two times a day (BID) | ORAL | 0 refills | Status: AC

## 2022-05-20 MED ORDER — CEFTRIAXONE SODIUM 500 MG IJ SOLR
INTRAMUSCULAR | Status: AC
  Filled 2022-05-20: qty 500

## 2022-05-20 NOTE — Discharge Instructions (Addendum)
Your chest x-ray did not show any abnormality.  You have been given a shot of ceftriaxone 500 mg  Take doxycycline 100 mg --1 capsule 2 times daily for 7 days  Take ibuprofen 800 mg--1 tab every 8 hours as needed for pain.  Definite notify you of any positives on your swabs.  Please use condoms more consistently, so that that would prevent sexually transmitted infections for you.
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
## 2022-05-21 LAB — CYTOLOGY, (ORAL, ANAL, URETHRAL) ANCILLARY ONLY
Chlamydia: POSITIVE — AB
Comment: NEGATIVE
Comment: NEGATIVE
Comment: NORMAL
Neisseria Gonorrhea: POSITIVE — AB
Trichomonas: NEGATIVE

## 2022-10-23 ENCOUNTER — Emergency Department (HOSPITAL_COMMUNITY): Payer: Medicaid Other

## 2022-10-23 ENCOUNTER — Emergency Department (HOSPITAL_COMMUNITY)
Admission: EM | Admit: 2022-10-23 | Discharge: 2022-10-23 | Disposition: A | Discharge status: Home / Self Care | Payer: Medicaid Other | Attending: Emergency Medicine | Admitting: Emergency Medicine

## 2022-10-23 DIAGNOSIS — W3400XA Accidental discharge from unspecified firearms or gun, initial encounter: Secondary | ICD-10-CM | POA: Insufficient documentation

## 2022-10-23 DIAGNOSIS — S51801A Unspecified open wound of right forearm, initial encounter: Secondary | ICD-10-CM | POA: Diagnosis not present

## 2022-10-23 DIAGNOSIS — S41101A Unspecified open wound of right upper arm, initial encounter: Secondary | ICD-10-CM | POA: Diagnosis not present

## 2022-10-23 DIAGNOSIS — Z23 Encounter for immunization: Secondary | ICD-10-CM | POA: Diagnosis not present

## 2022-10-23 DIAGNOSIS — S4991XA Unspecified injury of right shoulder and upper arm, initial encounter: Secondary | ICD-10-CM | POA: Diagnosis not present

## 2022-10-23 LAB — I-STAT CHEM 8, ED
BUN: 25 mg/dL — ABNORMAL HIGH (ref 6–20)
Calcium, Ion: 1.16 mmol/L (ref 1.15–1.40)
Chloride: 102 mmol/L (ref 98–111)
Creatinine, Ser: 1.2 mg/dL (ref 0.61–1.24)
Glucose, Bld: 89 mg/dL (ref 70–99)
HCT: 41 % (ref 39.0–52.0)
Hemoglobin: 13.9 g/dL (ref 13.0–17.0)
Potassium: 3.9 mmol/L (ref 3.5–5.1)
Sodium: 140 mmol/L (ref 135–145)
TCO2: 26 mmol/L (ref 22–32)

## 2022-10-23 MED ORDER — LACTATED RINGERS IV BOLUS
1000.0000 mL | Freq: Once | INTRAVENOUS | Status: AC
  Administered 2022-10-23: 1000 mL via INTRAVENOUS

## 2022-10-23 MED ORDER — TETANUS-DIPHTH-ACELL PERTUSSIS 5-2.5-18.5 LF-MCG/0.5 IM SUSY
0.5000 mL | PREFILLED_SYRINGE | Freq: Once | INTRAMUSCULAR | Status: AC
  Administered 2022-10-23: 0.5 mL via INTRAMUSCULAR
  Filled 2022-10-23: qty 0.5

## 2022-10-23 NOTE — ED Notes (Signed)
Called pt mother, still goes to [facility]

## 2022-10-23 NOTE — ED Notes (Signed)
Patient's mother is asking for an update. When it is possible please call Billy Bailey. Her number [phone]. And his grandmother [name] works in [employer].
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
## 2022-10-23 NOTE — Discharge Instructions (Signed)
Please care for your wound like you would any other using Neosporin.  Keep it clean, dry and covered when outside.  Return here for any signs or symptoms of infection such as redness, purulent drainage, fevers, significant swelling or pain.  Tylenol and ibuprofen for your pain in the meantime.

## 2022-10-23 NOTE — ED Notes (Signed)
Left voicemail for pt mother to return call

## 2022-10-23 NOTE — ED Notes (Signed)
To wound area, Xeroform dressing, guaze and wrapped with ace bandage placed.

## 2023-12-26 ENCOUNTER — Telehealth: Payer: Self-pay | Admitting: Student
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
## 2024-01-24 ENCOUNTER — Telehealth (HOSPITAL_COMMUNITY): Payer: Self-pay | Admitting: Licensed Clinical Social Worker

## 2024-01-24 ENCOUNTER — Telehealth (HOSPITAL_COMMUNITY): Payer: Self-pay

## 2024-01-24 NOTE — Telephone Encounter (Signed)
 Front desk calls this therapist to say this pt is on the phone and are not sure what to say to him. Therapist asks that they forward the call to her.  Pt identifies himself as Billy Bailey and therapist confirms his identify by obtaining two verifiers. He says that [facility] wants him to come in and have an evaluation. Therapist informs him of the walk in days/times and [name] says he will try to catch an uber ride and be at the clinic at 7am tomorrow.  Therapist sends this message to [name] with [facility], as she had just emailed asking for an update on this pt: "Hello, Noralee Beam C just called and I spoke to him. He says he is planning to come to walk in clinic this week and he will aim for tomorrow morning.  Just wanted to update you"  Earnie Gola, MS, LMFT, LCAS

## 2024-01-24 NOTE — Telephone Encounter (Signed)
 The therapist receives a ROI from Ms. Cosette C. Nerogic, B.S., CADC-I with [facility] and sends the following, secure email:  "Lastly, we have had no contact with Billy Bailey to this point in time and he has no future appointments scheduled with us ."   Melynda Stagger, MA, LCSW, [facility], LCAS 01/24/2024

## 2024-02-20 ENCOUNTER — Telehealth (HOSPITAL_COMMUNITY): Payer: Self-pay

## 2024-02-20 NOTE — Telephone Encounter (Signed)
 [name] from [facility] emailed this therapist to ask for update on this pt:  Therapist secured email back this message: No further updates from the one noted in the below last email.  never followed up.  Darice Simpler, MS, LMFT, LCAS
